# Patient Record
Sex: Female | Born: 1935 | Race: White | Hispanic: No | Smoking: Never smoker
Health system: Southern US, Community
[De-identification: ages and names within clinical notes are randomized; demographics above are authoritative.]

## PROBLEM LIST (undated history)

## (undated) DIAGNOSIS — F0390 Unspecified dementia without behavioral disturbance: Secondary | ICD-10-CM

## (undated) DIAGNOSIS — I10 Essential (primary) hypertension: Secondary | ICD-10-CM

## (undated) DIAGNOSIS — F329 Major depressive disorder, single episode, unspecified: Secondary | ICD-10-CM

## (undated) DIAGNOSIS — F32A Depression, unspecified: Secondary | ICD-10-CM

## (undated) DIAGNOSIS — H409 Unspecified glaucoma: Secondary | ICD-10-CM

## (undated) DIAGNOSIS — E7800 Pure hypercholesterolemia, unspecified: Secondary | ICD-10-CM

## (undated) HISTORY — PX: ANKLE FRACTURE SURGERY: SHX122

---

## 1999-03-28 ENCOUNTER — Other Ambulatory Visit: Admission: RE | Admit: 1999-03-28 | Discharge: 1999-03-28 | Payer: Self-pay | Admitting: Gynecology

## 2000-04-16 ENCOUNTER — Encounter: Admission: RE | Admit: 2000-04-16 | Discharge: 2000-04-16 | Payer: Self-pay | Admitting: Gynecology

## 2000-04-16 ENCOUNTER — Encounter: Payer: Self-pay | Admitting: Gynecology

## 2000-06-02 ENCOUNTER — Other Ambulatory Visit: Admission: RE | Admit: 2000-06-02 | Discharge: 2000-06-02 | Payer: Self-pay | Admitting: Gynecology

## 2001-05-11 ENCOUNTER — Encounter: Admission: RE | Admit: 2001-05-11 | Discharge: 2001-05-11 | Payer: Self-pay | Admitting: Gynecology

## 2001-05-11 ENCOUNTER — Encounter: Payer: Self-pay | Admitting: Gynecology

## 2001-06-23 ENCOUNTER — Other Ambulatory Visit: Admission: RE | Admit: 2001-06-23 | Discharge: 2001-06-23 | Payer: Self-pay | Admitting: Gynecology

## 2002-06-02 ENCOUNTER — Encounter: Payer: Self-pay | Admitting: Gynecology

## 2002-06-02 ENCOUNTER — Encounter: Admission: RE | Admit: 2002-06-02 | Discharge: 2002-06-02 | Payer: Self-pay | Admitting: Gynecology

## 2002-06-27 ENCOUNTER — Other Ambulatory Visit: Admission: RE | Admit: 2002-06-27 | Discharge: 2002-06-27 | Payer: Self-pay | Admitting: Gynecology

## 2003-06-30 ENCOUNTER — Encounter: Admission: RE | Admit: 2003-06-30 | Discharge: 2003-06-30 | Payer: Self-pay | Admitting: Orthopedic Surgery

## 2003-07-24 ENCOUNTER — Encounter: Admission: RE | Admit: 2003-07-24 | Discharge: 2003-07-24 | Payer: Self-pay | Admitting: Gynecology

## 2003-08-06 ENCOUNTER — Other Ambulatory Visit: Admission: RE | Admit: 2003-08-06 | Discharge: 2003-08-06 | Payer: Self-pay | Admitting: Gynecology

## 2004-03-22 ENCOUNTER — Inpatient Hospital Stay (HOSPITAL_COMMUNITY): Admission: RE | Admit: 2004-03-22 | Discharge: 2004-03-23 | Payer: Self-pay | Admitting: Orthopedic Surgery

## 2004-05-01 ENCOUNTER — Ambulatory Visit (HOSPITAL_COMMUNITY): Admission: RE | Admit: 2004-05-01 | Discharge: 2004-05-01 | Payer: Self-pay | Admitting: Internal Medicine

## 2004-09-17 ENCOUNTER — Encounter: Admission: RE | Admit: 2004-09-17 | Discharge: 2004-09-17 | Payer: Self-pay | Admitting: Gynecology

## 2005-09-18 ENCOUNTER — Ambulatory Visit: Payer: Self-pay | Admitting: Internal Medicine

## 2005-09-30 ENCOUNTER — Ambulatory Visit: Payer: Self-pay | Admitting: Internal Medicine

## 2005-09-30 ENCOUNTER — Encounter (INDEPENDENT_AMBULATORY_CARE_PROVIDER_SITE_OTHER): Payer: Self-pay | Admitting: *Deleted

## 2005-10-22 ENCOUNTER — Encounter: Admission: RE | Admit: 2005-10-22 | Discharge: 2005-10-22 | Payer: Self-pay | Admitting: Gynecology

## 2005-11-03 ENCOUNTER — Other Ambulatory Visit: Admission: RE | Admit: 2005-11-03 | Discharge: 2005-11-03 | Payer: Self-pay | Admitting: Gynecology

## 2006-12-17 ENCOUNTER — Encounter: Admission: RE | Admit: 2006-12-17 | Discharge: 2006-12-17 | Payer: Self-pay | Admitting: Gynecology

## 2008-02-07 ENCOUNTER — Encounter: Admission: RE | Admit: 2008-02-07 | Discharge: 2008-02-07 | Payer: Self-pay | Admitting: Gynecology

## 2009-02-27 ENCOUNTER — Encounter: Admission: RE | Admit: 2009-02-27 | Discharge: 2009-02-27 | Payer: Self-pay | Admitting: Gynecology

## 2009-12-23 ENCOUNTER — Emergency Department (HOSPITAL_COMMUNITY)
Admission: EM | Admit: 2009-12-23 | Discharge: 2009-12-23 | Payer: Self-pay | Source: Home / Self Care | Admitting: Emergency Medicine

## 2010-05-09 ENCOUNTER — Encounter: Admission: RE | Admit: 2010-05-09 | Discharge: 2010-05-09 | Payer: Self-pay | Admitting: Gynecology

## 2010-10-30 ENCOUNTER — Encounter: Payer: Self-pay | Admitting: Internal Medicine

## 2010-11-28 NOTE — Op Note (Signed)
NAME:  Mary Keller, Mary Keller                       ACCOUNT NO.:  [id number]   MEDICAL RECORD NO.:  [id number]                   PATIENT TYPE:  AMB   LOCATION:  DAY                                  FACILITY:  [facility]   PHYSICIAN:  Ronald A. Gioffre, M.D.             DATE OF BIRTH:  [birth year]   DATE OF PROCEDURE:  DATE OF DISCHARGE:  03/21/2004                                 OPERATIVE REPORT   PREOPERATIVE DIAGNOSES:  1.  Large extruded herniated disk at L4-5 on the right with a preop foot      drop on the right.  2.  Lateral recess stenosis severe at L4-5 on the right.   POSTOPERATIVE DIAGNOSES:  1.  Large extruded herniated disk at L4-5 on the right with a preop foot      drop on the right.  2.  Lateral recess stenosis severe at L4-5 on the right.   OPERATION:  1.  Hemilaminectomy and microdiskectomy at L4-5 on the right with a wide      foraminotomy because of the extruded fragment that was extruded      distalward.  2.  Decompression of the lateral recess for lateral recess stenosis.   SURGEON:  Georges Lynch. Darrelyn Hillock, M.D.   ASSISTANT:  Marlowe Kays, M.D.   DESCRIPTION OF PROCEDURE:  Under general anesthesia, the patient on a spinal  frame, she first had 1 g of IV Ancef.  At this time, two needles were placed  in the back for localization purposes, x-rays were taken.  Once we  identified the L4-5 interspace, we carried out the incision down a little  more distal and went down to the space and another x-ray was taken.  Several  x-rays were taken just to verify the exact final position.  Once the L4-5  space was identified, I then utilized the bur to bur down a portion of the  lamina at the L4-5 level and I then did a hemilaminectomy in the usual  fashion.  Her ligamentum flavum was severely thickened and compressing down  into the dura with the lateral recess stenosis so we went out at first  because of the large extruded disk and the stenosis, I did a nice  foraminotomy to  first identify the L5 root distally and we worked our way up  proximally and laterally. Once we cleaned out the lateral recess, we  cauterized lateral recess veins, the nerve root was under extreme amount of  tension, we gently retracted it and went down into the space to decompress  the space first and then went subligamentous with the nerve hook as well as  the Epstein curettes and removed multiple fragments of disk material.  We  also went down into the disk space and did a microdiskectomy as I mentioned  by use of the microscope. We went proximal, medial and distally. We went  down into the foramen, made sure there were no  other loose pieces of disk,  there was not. The nerve root now was free and it was easily movable as well  as the dura.  We made multiple passes up under the subligamentous region  which was just up under the nerve root to make sure there were no other  loose pieces of fragment.  We did a nice decompression of the lateral recess  as well and we made sure that the area proximal was clear as well with the  root above. We thoroughly irrigated out the area, loosely applied some  thrombin soaked Gelfoam and the wound was closed in layers in the usual  fashion with the deep distal portion of the wound  left open for drainage purposes. We left a small area open and the remaining  part of the wound was closed as I mentioned in the usual fashion. The skin  was closed with metal staples. She had 30 mg of IV Toradol at the end of the  procedure. She had 1 g of IV Ancef in surgery.                                               Ronald A. Darrelyn Hillock, M.D.    RAG/MEDQ  D:  03/21/2004  T:  03/22/2004  Job:  [id number]

## 2010-11-28 NOTE — Discharge Summary (Signed)
NAME:  Mary Keller, Mary Keller NO.:  [id number]   MEDICAL RECORD NO.:  [id number]          PATIENT TYPE:  INP   LOCATION:  0454                         FACILITY:  [facility]   PHYSICIAN:  Georges Lynch. Gioffre, M.D.DATE OF BIRTH:  May 24, 1934   DATE OF ADMISSION:  03/21/2004  DATE OF DISCHARGE:  03/23/2004                                 DISCHARGE SUMMARY   ADMISSION DIAGNOSES:  1.  Herniated disk L4-5.  2.  Spinal stenosis L4-5.   DISCHARGE DIAGNOSES:  1.  Status post hemilaminectomy and microdiskectomy L4-5 on the right.  2.  Foraminotomy and decompression for a lateral recess stenosis.   PROCEDURES:  The patient was taken to the operating room on March 21, 2004 to undergo microdiskectomy and hemilaminectomy of L4-5 on the right.   SURGEON:  Ranee Gosselin, M.D.   ASSISTANT:  Marlowe Kays, M.D.   ANESTHESIA:  The surgery was performed under general anesthesia.   BRIEF HISTORY:  This patient is a 77 year old female who has had back pain  radiating into her right lower extremity since August.  The symptoms are  worsening over the past few weeks.  The symptoms tend to be worse with  sitting, standing, and walking.  Pain medication reduced her pain somewhat.  She describes a burning sensation into her right buttock radiating down her  right posterior calf.  The patient was evaluated in the office by Dr.  Darrelyn Hillock.  She was found to have a ruptured disk at L4-5 on her MRI and was  admitted to the hospital for surgery.   LABORATORY DATA:  Admission hemoglobin 14.8, hematocrit 43.4.  Admission  chemistries sodium 139, potassium 3.9, chloride 104, CO2 of 25, glucose 102,  BUN 12, creatinine 0.9, calcium 9.9.  Admission EKG showed a nonspecific T  wave abnormality at a rate of 87.  Admission chest x-ray no acute  cardiopulmonary process.   HOSPITAL COURSE:  The patient was admitted to [facility] and taken  to the operating room.  She underwent the above stated  procedure without  complications.  She was allowed to return to the recovery room and then to  the orthopedic floor to continue her postoperative care.  The patient was  placed on PCA analgesia for pain control.  The pain was well controlled and  she was weaned over to oral analgesics and her PCA was discontinued on  postoperative day #1.  PT was consulted for gait training.  She was able to  ambulate with the aide of a walker.  The patient progressed well.  Her pain  was well controlled.  She was discharged home on postoperative day #2.   DISPOSITION:  Discharge date March 23, 2004.   DISCHARGE MEDICATIONS:  1.  Percocet 10/650 one-two every six hours as needed for pain.  2.  Robaxin 500 mg 1 every 6 hours as needed for muscle spasm.   DIET:  As tolerated.   ACTIVITY:  Walking.  No lifting or bending at the waist.   WOUND CARE:  Daily dressing changes.   FOLLOW UP:  The patient is scheduled to follow  up with Dr. Darrelyn Hillock two weeks  from the date of surgery.  She will call the office to schedule the  appointment.   CONDITION ON DISCHARGE:  Stable.      LKP/MEDQ  D:  04/23/2004  T:  04/23/2004  Job:  [id number]

## 2011-06-12 ENCOUNTER — Other Ambulatory Visit: Payer: Self-pay | Admitting: Gynecology

## 2011-06-12 DIAGNOSIS — Z1231 Encounter for screening mammogram for malignant neoplasm of breast: Secondary | ICD-10-CM

## 2011-06-17 ENCOUNTER — Ambulatory Visit
Admission: RE | Admit: 2011-06-17 | Discharge: 2011-06-17 | Disposition: A | Discharge status: Home / Self Care | Payer: Medicare Other | Source: Ambulatory Visit | Attending: Gynecology | Admitting: Gynecology

## 2011-06-17 DIAGNOSIS — Z1231 Encounter for screening mammogram for malignant neoplasm of breast: Secondary | ICD-10-CM

## 2011-06-23 ENCOUNTER — Other Ambulatory Visit: Payer: Self-pay | Admitting: Gynecology

## 2011-08-11 DIAGNOSIS — I10 Essential (primary) hypertension: Secondary | ICD-10-CM | POA: Diagnosis not present

## 2011-08-11 DIAGNOSIS — E119 Type 2 diabetes mellitus without complications: Secondary | ICD-10-CM | POA: Diagnosis not present

## 2011-08-11 DIAGNOSIS — H4011X0 Primary open-angle glaucoma, stage unspecified: Secondary | ICD-10-CM | POA: Diagnosis not present

## 2011-08-11 DIAGNOSIS — R82998 Other abnormal findings in urine: Secondary | ICD-10-CM | POA: Diagnosis not present

## 2011-08-11 DIAGNOSIS — E785 Hyperlipidemia, unspecified: Secondary | ICD-10-CM | POA: Diagnosis not present

## 2011-08-18 DIAGNOSIS — Z0000 Encounter for general adult medical examination without abnormal findings: Secondary | ICD-10-CM | POA: Diagnosis not present

## 2011-08-18 DIAGNOSIS — I10 Essential (primary) hypertension: Secondary | ICD-10-CM | POA: Diagnosis not present

## 2011-08-18 DIAGNOSIS — E785 Hyperlipidemia, unspecified: Secondary | ICD-10-CM | POA: Diagnosis not present

## 2011-08-18 DIAGNOSIS — F341 Dysthymic disorder: Secondary | ICD-10-CM | POA: Diagnosis not present

## 2011-08-24 DIAGNOSIS — Z1212 Encounter for screening for malignant neoplasm of rectum: Secondary | ICD-10-CM | POA: Diagnosis not present

## 2011-09-14 DIAGNOSIS — H4011X0 Primary open-angle glaucoma, stage unspecified: Secondary | ICD-10-CM | POA: Diagnosis not present

## 2011-11-03 DIAGNOSIS — H4310 Vitreous hemorrhage, unspecified eye: Secondary | ICD-10-CM | POA: Diagnosis not present

## 2011-11-03 DIAGNOSIS — H348392 Tributary (branch) retinal vein occlusion, unspecified eye, stable: Secondary | ICD-10-CM | POA: Diagnosis not present

## 2011-11-03 DIAGNOSIS — H409 Unspecified glaucoma: Secondary | ICD-10-CM | POA: Diagnosis not present

## 2011-11-03 DIAGNOSIS — H4011X0 Primary open-angle glaucoma, stage unspecified: Secondary | ICD-10-CM | POA: Diagnosis not present

## 2011-12-01 DIAGNOSIS — I10 Essential (primary) hypertension: Secondary | ICD-10-CM | POA: Diagnosis not present

## 2011-12-01 DIAGNOSIS — IMO0002 Reserved for concepts with insufficient information to code with codable children: Secondary | ICD-10-CM | POA: Diagnosis not present

## 2011-12-18 DIAGNOSIS — J029 Acute pharyngitis, unspecified: Secondary | ICD-10-CM | POA: Diagnosis not present

## 2011-12-18 DIAGNOSIS — I10 Essential (primary) hypertension: Secondary | ICD-10-CM | POA: Diagnosis not present

## 2011-12-18 DIAGNOSIS — M279 Disease of jaws, unspecified: Secondary | ICD-10-CM | POA: Diagnosis not present

## 2012-01-18 DIAGNOSIS — H4011X0 Primary open-angle glaucoma, stage unspecified: Secondary | ICD-10-CM | POA: Diagnosis not present

## 2012-01-18 DIAGNOSIS — H409 Unspecified glaucoma: Secondary | ICD-10-CM | POA: Diagnosis not present

## 2012-03-03 DIAGNOSIS — I10 Essential (primary) hypertension: Secondary | ICD-10-CM | POA: Diagnosis not present

## 2012-03-03 DIAGNOSIS — E669 Obesity, unspecified: Secondary | ICD-10-CM | POA: Diagnosis not present

## 2012-03-03 DIAGNOSIS — E119 Type 2 diabetes mellitus without complications: Secondary | ICD-10-CM | POA: Diagnosis not present

## 2012-03-03 DIAGNOSIS — E785 Hyperlipidemia, unspecified: Secondary | ICD-10-CM | POA: Diagnosis not present

## 2012-04-05 DIAGNOSIS — Z23 Encounter for immunization: Secondary | ICD-10-CM | POA: Diagnosis not present

## 2012-04-07 ENCOUNTER — Encounter: Payer: Self-pay | Admitting: Internal Medicine

## 2012-06-06 DIAGNOSIS — H4011X0 Primary open-angle glaucoma, stage unspecified: Secondary | ICD-10-CM | POA: Diagnosis not present

## 2012-06-06 DIAGNOSIS — H348392 Tributary (branch) retinal vein occlusion, unspecified eye, stable: Secondary | ICD-10-CM | POA: Diagnosis not present

## 2012-06-22 DIAGNOSIS — E785 Hyperlipidemia, unspecified: Secondary | ICD-10-CM | POA: Diagnosis not present

## 2012-06-22 DIAGNOSIS — I10 Essential (primary) hypertension: Secondary | ICD-10-CM | POA: Diagnosis not present

## 2012-06-22 DIAGNOSIS — E669 Obesity, unspecified: Secondary | ICD-10-CM | POA: Diagnosis not present

## 2012-06-22 DIAGNOSIS — F341 Dysthymic disorder: Secondary | ICD-10-CM | POA: Diagnosis not present

## 2012-07-14 DIAGNOSIS — H4011X0 Primary open-angle glaucoma, stage unspecified: Secondary | ICD-10-CM | POA: Diagnosis not present

## 2012-07-18 DIAGNOSIS — H4011X0 Primary open-angle glaucoma, stage unspecified: Secondary | ICD-10-CM | POA: Diagnosis not present

## 2012-07-26 ENCOUNTER — Other Ambulatory Visit: Payer: Self-pay | Admitting: Gynecology

## 2012-07-26 DIAGNOSIS — Z1231 Encounter for screening mammogram for malignant neoplasm of breast: Secondary | ICD-10-CM

## 2012-08-18 ENCOUNTER — Ambulatory Visit
Admission: RE | Admit: 2012-08-18 | Discharge: 2012-08-18 | Disposition: A | Discharge status: Home / Self Care | Payer: Medicare Other | Source: Ambulatory Visit | Attending: Gynecology | Admitting: Gynecology

## 2012-08-18 DIAGNOSIS — Z1231 Encounter for screening mammogram for malignant neoplasm of breast: Secondary | ICD-10-CM | POA: Diagnosis not present

## 2012-08-31 ENCOUNTER — Other Ambulatory Visit: Payer: Self-pay | Admitting: Gynecology

## 2012-09-01 DIAGNOSIS — E119 Type 2 diabetes mellitus without complications: Secondary | ICD-10-CM | POA: Diagnosis not present

## 2012-09-05 DIAGNOSIS — H4011X0 Primary open-angle glaucoma, stage unspecified: Secondary | ICD-10-CM | POA: Diagnosis not present

## 2012-09-08 DIAGNOSIS — Z0000 Encounter for general adult medical examination without abnormal findings: Secondary | ICD-10-CM | POA: Diagnosis not present

## 2012-09-09 ENCOUNTER — Ambulatory Visit
Admission: RE | Admit: 2012-09-09 | Discharge: 2012-09-09 | Disposition: A | Discharge status: Home / Self Care | Payer: Medicare Other | Source: Ambulatory Visit | Attending: Gynecology | Admitting: Gynecology

## 2012-09-09 DIAGNOSIS — M949 Disorder of cartilage, unspecified: Secondary | ICD-10-CM | POA: Diagnosis not present

## 2013-02-09 DIAGNOSIS — H4011X0 Primary open-angle glaucoma, stage unspecified: Secondary | ICD-10-CM | POA: Diagnosis not present

## 2013-02-13 DIAGNOSIS — H348392 Tributary (branch) retinal vein occlusion, unspecified eye, stable: Secondary | ICD-10-CM | POA: Diagnosis not present

## 2013-02-13 DIAGNOSIS — H4011X0 Primary open-angle glaucoma, stage unspecified: Secondary | ICD-10-CM | POA: Diagnosis not present

## 2013-03-08 DIAGNOSIS — M1990 Unspecified osteoarthritis, unspecified site: Secondary | ICD-10-CM | POA: Diagnosis not present

## 2013-03-08 DIAGNOSIS — Z6827 Body mass index (BMI) 27.0-27.9, adult: Secondary | ICD-10-CM | POA: Diagnosis not present

## 2013-03-08 DIAGNOSIS — Z23 Encounter for immunization: Secondary | ICD-10-CM | POA: Diagnosis not present

## 2013-03-08 DIAGNOSIS — E119 Type 2 diabetes mellitus without complications: Secondary | ICD-10-CM | POA: Diagnosis not present

## 2013-03-08 DIAGNOSIS — E785 Hyperlipidemia, unspecified: Secondary | ICD-10-CM | POA: Diagnosis not present

## 2013-03-08 DIAGNOSIS — I10 Essential (primary) hypertension: Secondary | ICD-10-CM | POA: Diagnosis not present

## 2013-03-08 DIAGNOSIS — Z1331 Encounter for screening for depression: Secondary | ICD-10-CM | POA: Diagnosis not present

## 2013-05-16 DIAGNOSIS — H4011X0 Primary open-angle glaucoma, stage unspecified: Secondary | ICD-10-CM | POA: Diagnosis not present

## 2013-05-16 DIAGNOSIS — Z961 Presence of intraocular lens: Secondary | ICD-10-CM | POA: Diagnosis not present

## 2013-05-16 DIAGNOSIS — H04129 Dry eye syndrome of unspecified lacrimal gland: Secondary | ICD-10-CM | POA: Diagnosis not present

## 2013-09-18 ENCOUNTER — Other Ambulatory Visit: Payer: Self-pay

## 2013-09-18 DIAGNOSIS — Z1231 Encounter for screening mammogram for malignant neoplasm of breast: Secondary | ICD-10-CM

## 2013-09-20 ENCOUNTER — Ambulatory Visit
Admission: RE | Admit: 2013-09-20 | Discharge: 2013-09-20 | Disposition: A | Discharge status: Home / Self Care | Payer: Medicare Other | Source: Ambulatory Visit

## 2013-09-20 DIAGNOSIS — Z0000 Encounter for general adult medical examination without abnormal findings: Secondary | ICD-10-CM | POA: Diagnosis not present

## 2013-09-20 DIAGNOSIS — Z1231 Encounter for screening mammogram for malignant neoplasm of breast: Secondary | ICD-10-CM | POA: Diagnosis not present

## 2013-09-20 DIAGNOSIS — Z780 Asymptomatic menopausal state: Secondary | ICD-10-CM | POA: Diagnosis not present

## 2013-09-20 DIAGNOSIS — Z01419 Encounter for gynecological examination (general) (routine) without abnormal findings: Secondary | ICD-10-CM | POA: Diagnosis not present

## 2013-09-22 DIAGNOSIS — E785 Hyperlipidemia, unspecified: Secondary | ICD-10-CM | POA: Diagnosis not present

## 2013-09-22 DIAGNOSIS — E119 Type 2 diabetes mellitus without complications: Secondary | ICD-10-CM | POA: Diagnosis not present

## 2013-09-22 DIAGNOSIS — I10 Essential (primary) hypertension: Secondary | ICD-10-CM | POA: Diagnosis not present

## 2013-09-22 DIAGNOSIS — R82998 Other abnormal findings in urine: Secondary | ICD-10-CM | POA: Diagnosis not present

## 2013-09-29 DIAGNOSIS — Z23 Encounter for immunization: Secondary | ICD-10-CM | POA: Diagnosis not present

## 2013-09-29 DIAGNOSIS — E785 Hyperlipidemia, unspecified: Secondary | ICD-10-CM | POA: Diagnosis not present

## 2013-09-29 DIAGNOSIS — Z6828 Body mass index (BMI) 28.0-28.9, adult: Secondary | ICD-10-CM | POA: Diagnosis not present

## 2013-09-29 DIAGNOSIS — M1990 Unspecified osteoarthritis, unspecified site: Secondary | ICD-10-CM | POA: Diagnosis not present

## 2013-09-29 DIAGNOSIS — E1169 Type 2 diabetes mellitus with other specified complication: Secondary | ICD-10-CM | POA: Diagnosis not present

## 2013-09-29 DIAGNOSIS — E669 Obesity, unspecified: Secondary | ICD-10-CM | POA: Diagnosis not present

## 2013-09-29 DIAGNOSIS — Z0000 Encounter for general adult medical examination without abnormal findings: Secondary | ICD-10-CM | POA: Diagnosis not present

## 2013-09-29 DIAGNOSIS — I10 Essential (primary) hypertension: Secondary | ICD-10-CM | POA: Diagnosis not present

## 2013-10-05 DIAGNOSIS — Z1212 Encounter for screening for malignant neoplasm of rectum: Secondary | ICD-10-CM | POA: Diagnosis not present

## 2013-10-30 DIAGNOSIS — H348392 Tributary (branch) retinal vein occlusion, unspecified eye, stable: Secondary | ICD-10-CM | POA: Diagnosis not present

## 2013-10-30 DIAGNOSIS — H409 Unspecified glaucoma: Secondary | ICD-10-CM | POA: Diagnosis not present

## 2013-10-30 DIAGNOSIS — H4011X0 Primary open-angle glaucoma, stage unspecified: Secondary | ICD-10-CM | POA: Diagnosis not present

## 2013-11-08 DIAGNOSIS — T8489XA Other specified complication of internal orthopedic prosthetic devices, implants and grafts, initial encounter: Secondary | ICD-10-CM | POA: Diagnosis not present

## 2013-11-08 DIAGNOSIS — M19079 Primary osteoarthritis, unspecified ankle and foot: Secondary | ICD-10-CM | POA: Diagnosis not present

## 2013-12-11 DIAGNOSIS — H409 Unspecified glaucoma: Secondary | ICD-10-CM | POA: Diagnosis not present

## 2013-12-11 DIAGNOSIS — H4011X0 Primary open-angle glaucoma, stage unspecified: Secondary | ICD-10-CM | POA: Diagnosis not present

## 2013-12-11 DIAGNOSIS — H348392 Tributary (branch) retinal vein occlusion, unspecified eye, stable: Secondary | ICD-10-CM | POA: Diagnosis not present

## 2014-01-08 DIAGNOSIS — H409 Unspecified glaucoma: Secondary | ICD-10-CM | POA: Diagnosis not present

## 2014-01-08 DIAGNOSIS — H4011X0 Primary open-angle glaucoma, stage unspecified: Secondary | ICD-10-CM | POA: Diagnosis not present

## 2014-02-14 DIAGNOSIS — H409 Unspecified glaucoma: Secondary | ICD-10-CM | POA: Diagnosis not present

## 2014-02-14 DIAGNOSIS — H4011X0 Primary open-angle glaucoma, stage unspecified: Secondary | ICD-10-CM | POA: Diagnosis not present

## 2014-04-04 DIAGNOSIS — Z23 Encounter for immunization: Secondary | ICD-10-CM | POA: Diagnosis not present

## 2014-04-04 DIAGNOSIS — E1169 Type 2 diabetes mellitus with other specified complication: Secondary | ICD-10-CM | POA: Diagnosis not present

## 2014-04-04 DIAGNOSIS — Z780 Asymptomatic menopausal state: Secondary | ICD-10-CM | POA: Diagnosis not present

## 2014-04-04 DIAGNOSIS — E785 Hyperlipidemia, unspecified: Secondary | ICD-10-CM | POA: Diagnosis not present

## 2014-04-04 DIAGNOSIS — M1990 Unspecified osteoarthritis, unspecified site: Secondary | ICD-10-CM | POA: Diagnosis not present

## 2014-04-04 DIAGNOSIS — Z1331 Encounter for screening for depression: Secondary | ICD-10-CM | POA: Diagnosis not present

## 2014-04-04 DIAGNOSIS — I10 Essential (primary) hypertension: Secondary | ICD-10-CM | POA: Diagnosis not present

## 2014-04-04 DIAGNOSIS — Z6827 Body mass index (BMI) 27.0-27.9, adult: Secondary | ICD-10-CM | POA: Diagnosis not present

## 2014-04-04 DIAGNOSIS — M899 Disorder of bone, unspecified: Secondary | ICD-10-CM | POA: Diagnosis not present

## 2014-10-03 DIAGNOSIS — E119 Type 2 diabetes mellitus without complications: Secondary | ICD-10-CM | POA: Diagnosis not present

## 2014-10-03 DIAGNOSIS — E669 Obesity, unspecified: Secondary | ICD-10-CM | POA: Diagnosis not present

## 2014-10-03 DIAGNOSIS — F418 Other specified anxiety disorders: Secondary | ICD-10-CM | POA: Diagnosis not present

## 2014-10-03 DIAGNOSIS — E785 Hyperlipidemia, unspecified: Secondary | ICD-10-CM | POA: Diagnosis not present

## 2014-10-03 DIAGNOSIS — M1990 Unspecified osteoarthritis, unspecified site: Secondary | ICD-10-CM | POA: Diagnosis not present

## 2014-10-03 DIAGNOSIS — Z1389 Encounter for screening for other disorder: Secondary | ICD-10-CM | POA: Diagnosis not present

## 2014-10-03 DIAGNOSIS — Z6827 Body mass index (BMI) 27.0-27.9, adult: Secondary | ICD-10-CM | POA: Diagnosis not present

## 2014-10-03 DIAGNOSIS — I10 Essential (primary) hypertension: Secondary | ICD-10-CM | POA: Diagnosis not present

## 2014-10-18 DIAGNOSIS — Z780 Asymptomatic menopausal state: Secondary | ICD-10-CM | POA: Diagnosis not present

## 2014-10-18 DIAGNOSIS — Z01419 Encounter for gynecological examination (general) (routine) without abnormal findings: Secondary | ICD-10-CM | POA: Diagnosis not present

## 2014-11-28 DIAGNOSIS — L259 Unspecified contact dermatitis, unspecified cause: Secondary | ICD-10-CM | POA: Diagnosis not present

## 2014-11-28 DIAGNOSIS — Z6827 Body mass index (BMI) 27.0-27.9, adult: Secondary | ICD-10-CM | POA: Diagnosis not present

## 2014-12-20 DIAGNOSIS — Z961 Presence of intraocular lens: Secondary | ICD-10-CM | POA: Diagnosis not present

## 2014-12-20 DIAGNOSIS — H4011X1 Primary open-angle glaucoma, mild stage: Secondary | ICD-10-CM | POA: Diagnosis not present

## 2014-12-21 DIAGNOSIS — H4011X1 Primary open-angle glaucoma, mild stage: Secondary | ICD-10-CM | POA: Diagnosis not present

## 2014-12-21 DIAGNOSIS — Z961 Presence of intraocular lens: Secondary | ICD-10-CM | POA: Diagnosis not present

## 2014-12-21 DIAGNOSIS — H4011X2 Primary open-angle glaucoma, moderate stage: Secondary | ICD-10-CM | POA: Diagnosis not present

## 2015-01-02 DIAGNOSIS — H4011X1 Primary open-angle glaucoma, mild stage: Secondary | ICD-10-CM | POA: Diagnosis not present

## 2015-01-02 DIAGNOSIS — H4011X2 Primary open-angle glaucoma, moderate stage: Secondary | ICD-10-CM | POA: Diagnosis not present

## 2015-03-22 ENCOUNTER — Other Ambulatory Visit: Payer: Self-pay

## 2015-03-22 DIAGNOSIS — Z1231 Encounter for screening mammogram for malignant neoplasm of breast: Secondary | ICD-10-CM

## 2015-03-27 ENCOUNTER — Ambulatory Visit
Admission: RE | Admit: 2015-03-27 | Discharge: 2015-03-27 | Disposition: A | Discharge status: Home / Self Care | Payer: Medicare Other | Source: Ambulatory Visit

## 2015-03-27 DIAGNOSIS — Z1231 Encounter for screening mammogram for malignant neoplasm of breast: Secondary | ICD-10-CM | POA: Diagnosis not present

## 2015-04-01 DIAGNOSIS — E119 Type 2 diabetes mellitus without complications: Secondary | ICD-10-CM | POA: Diagnosis not present

## 2015-04-01 DIAGNOSIS — N390 Urinary tract infection, site not specified: Secondary | ICD-10-CM | POA: Diagnosis not present

## 2015-04-01 DIAGNOSIS — I10 Essential (primary) hypertension: Secondary | ICD-10-CM | POA: Diagnosis not present

## 2015-04-01 DIAGNOSIS — R8290 Unspecified abnormal findings in urine: Secondary | ICD-10-CM | POA: Diagnosis not present

## 2015-04-01 DIAGNOSIS — E785 Hyperlipidemia, unspecified: Secondary | ICD-10-CM | POA: Diagnosis not present

## 2015-04-05 DIAGNOSIS — E119 Type 2 diabetes mellitus without complications: Secondary | ICD-10-CM | POA: Diagnosis not present

## 2015-04-05 DIAGNOSIS — Z6827 Body mass index (BMI) 27.0-27.9, adult: Secondary | ICD-10-CM | POA: Diagnosis not present

## 2015-04-05 DIAGNOSIS — I10 Essential (primary) hypertension: Secondary | ICD-10-CM | POA: Diagnosis not present

## 2015-04-05 DIAGNOSIS — Z0000 Encounter for general adult medical examination without abnormal findings: Secondary | ICD-10-CM | POA: Diagnosis not present

## 2015-04-05 DIAGNOSIS — M1990 Unspecified osteoarthritis, unspecified site: Secondary | ICD-10-CM | POA: Diagnosis not present

## 2015-04-05 DIAGNOSIS — E785 Hyperlipidemia, unspecified: Secondary | ICD-10-CM | POA: Diagnosis not present

## 2015-04-05 DIAGNOSIS — Z1389 Encounter for screening for other disorder: Secondary | ICD-10-CM | POA: Diagnosis not present

## 2015-04-05 DIAGNOSIS — F418 Other specified anxiety disorders: Secondary | ICD-10-CM | POA: Diagnosis not present

## 2015-04-05 DIAGNOSIS — Z23 Encounter for immunization: Secondary | ICD-10-CM | POA: Diagnosis not present

## 2015-04-05 DIAGNOSIS — E669 Obesity, unspecified: Secondary | ICD-10-CM | POA: Diagnosis not present

## 2015-05-01 DIAGNOSIS — Z1212 Encounter for screening for malignant neoplasm of rectum: Secondary | ICD-10-CM | POA: Diagnosis not present

## 2015-06-26 DIAGNOSIS — Z961 Presence of intraocular lens: Secondary | ICD-10-CM | POA: Diagnosis not present

## 2015-06-26 DIAGNOSIS — H1859 Other hereditary corneal dystrophies: Secondary | ICD-10-CM | POA: Diagnosis not present

## 2015-06-26 DIAGNOSIS — H401121 Primary open-angle glaucoma, left eye, mild stage: Secondary | ICD-10-CM | POA: Diagnosis not present

## 2015-06-26 DIAGNOSIS — H401112 Primary open-angle glaucoma, right eye, moderate stage: Secondary | ICD-10-CM | POA: Diagnosis not present

## 2015-09-25 DIAGNOSIS — H401131 Primary open-angle glaucoma, bilateral, mild stage: Secondary | ICD-10-CM | POA: Diagnosis not present

## 2015-09-25 DIAGNOSIS — H16143 Punctate keratitis, bilateral: Secondary | ICD-10-CM | POA: Diagnosis not present

## 2015-09-25 DIAGNOSIS — Z961 Presence of intraocular lens: Secondary | ICD-10-CM | POA: Diagnosis not present

## 2015-10-17 DIAGNOSIS — I10 Essential (primary) hypertension: Secondary | ICD-10-CM | POA: Diagnosis not present

## 2015-10-17 DIAGNOSIS — F418 Other specified anxiety disorders: Secondary | ICD-10-CM | POA: Diagnosis not present

## 2015-10-17 DIAGNOSIS — E119 Type 2 diabetes mellitus without complications: Secondary | ICD-10-CM | POA: Diagnosis not present

## 2015-10-17 DIAGNOSIS — Z1389 Encounter for screening for other disorder: Secondary | ICD-10-CM | POA: Diagnosis not present

## 2015-10-17 DIAGNOSIS — M1990 Unspecified osteoarthritis, unspecified site: Secondary | ICD-10-CM | POA: Diagnosis not present

## 2015-10-17 DIAGNOSIS — Z6826 Body mass index (BMI) 26.0-26.9, adult: Secondary | ICD-10-CM | POA: Diagnosis not present

## 2015-10-17 DIAGNOSIS — E669 Obesity, unspecified: Secondary | ICD-10-CM | POA: Diagnosis not present

## 2015-10-17 DIAGNOSIS — E784 Other hyperlipidemia: Secondary | ICD-10-CM | POA: Diagnosis not present

## 2015-10-18 DIAGNOSIS — H401131 Primary open-angle glaucoma, bilateral, mild stage: Secondary | ICD-10-CM | POA: Diagnosis not present

## 2015-10-28 DIAGNOSIS — H1859 Other hereditary corneal dystrophies: Secondary | ICD-10-CM | POA: Diagnosis not present

## 2015-10-28 DIAGNOSIS — H401131 Primary open-angle glaucoma, bilateral, mild stage: Secondary | ICD-10-CM | POA: Diagnosis not present

## 2015-10-28 DIAGNOSIS — H18529 Epithelial (juvenile) corneal dystrophy, unspecified eye: Secondary | ICD-10-CM | POA: Insufficient documentation

## 2015-12-02 DIAGNOSIS — H401131 Primary open-angle glaucoma, bilateral, mild stage: Secondary | ICD-10-CM | POA: Diagnosis not present

## 2016-01-17 ENCOUNTER — Other Ambulatory Visit: Payer: Self-pay

## 2016-01-17 DIAGNOSIS — Z6825 Body mass index (BMI) 25.0-25.9, adult: Secondary | ICD-10-CM | POA: Diagnosis not present

## 2016-01-17 DIAGNOSIS — N644 Mastodynia: Secondary | ICD-10-CM | POA: Diagnosis not present

## 2016-01-17 DIAGNOSIS — Z1231 Encounter for screening mammogram for malignant neoplasm of breast: Secondary | ICD-10-CM

## 2016-01-18 ENCOUNTER — Other Ambulatory Visit: Payer: Self-pay | Admitting: Registered Nurse

## 2016-01-18 DIAGNOSIS — N644 Mastodynia: Secondary | ICD-10-CM

## 2016-01-23 ENCOUNTER — Ambulatory Visit
Admission: RE | Admit: 2016-01-23 | Discharge: 2016-01-23 | Disposition: A | Discharge status: Home / Self Care | Payer: Medicare Other | Source: Ambulatory Visit | Attending: Registered Nurse | Admitting: Registered Nurse

## 2016-01-23 DIAGNOSIS — N644 Mastodynia: Secondary | ICD-10-CM

## 2016-03-04 DIAGNOSIS — H401131 Primary open-angle glaucoma, bilateral, mild stage: Secondary | ICD-10-CM | POA: Diagnosis not present

## 2016-03-04 DIAGNOSIS — H1859 Other hereditary corneal dystrophies: Secondary | ICD-10-CM | POA: Diagnosis not present

## 2016-04-17 ENCOUNTER — Other Ambulatory Visit: Payer: Self-pay | Admitting: Internal Medicine

## 2016-04-17 DIAGNOSIS — Z1231 Encounter for screening mammogram for malignant neoplasm of breast: Secondary | ICD-10-CM

## 2016-04-20 DIAGNOSIS — I10 Essential (primary) hypertension: Secondary | ICD-10-CM | POA: Diagnosis not present

## 2016-04-20 DIAGNOSIS — E119 Type 2 diabetes mellitus without complications: Secondary | ICD-10-CM | POA: Diagnosis not present

## 2016-04-20 DIAGNOSIS — N390 Urinary tract infection, site not specified: Secondary | ICD-10-CM | POA: Diagnosis not present

## 2016-04-20 DIAGNOSIS — E784 Other hyperlipidemia: Secondary | ICD-10-CM | POA: Diagnosis not present

## 2016-04-20 DIAGNOSIS — R8290 Unspecified abnormal findings in urine: Secondary | ICD-10-CM | POA: Diagnosis not present

## 2016-04-21 ENCOUNTER — Ambulatory Visit
Admission: RE | Admit: 2016-04-21 | Discharge: 2016-04-21 | Disposition: A | Discharge status: Home / Self Care | Payer: Medicare Other | Source: Ambulatory Visit | Attending: Internal Medicine | Admitting: Internal Medicine

## 2016-04-21 DIAGNOSIS — Z1231 Encounter for screening mammogram for malignant neoplasm of breast: Secondary | ICD-10-CM | POA: Diagnosis not present

## 2016-05-06 DIAGNOSIS — H04129 Dry eye syndrome of unspecified lacrimal gland: Secondary | ICD-10-CM | POA: Diagnosis not present

## 2016-05-06 DIAGNOSIS — E663 Overweight: Secondary | ICD-10-CM | POA: Diagnosis not present

## 2016-05-06 DIAGNOSIS — E119 Type 2 diabetes mellitus without complications: Secondary | ICD-10-CM | POA: Diagnosis not present

## 2016-05-06 DIAGNOSIS — Z23 Encounter for immunization: Secondary | ICD-10-CM | POA: Diagnosis not present

## 2016-05-06 DIAGNOSIS — E784 Other hyperlipidemia: Secondary | ICD-10-CM | POA: Diagnosis not present

## 2016-05-06 DIAGNOSIS — Z1389 Encounter for screening for other disorder: Secondary | ICD-10-CM | POA: Diagnosis not present

## 2016-05-06 DIAGNOSIS — F418 Other specified anxiety disorders: Secondary | ICD-10-CM | POA: Diagnosis not present

## 2016-05-06 DIAGNOSIS — M1990 Unspecified osteoarthritis, unspecified site: Secondary | ICD-10-CM | POA: Diagnosis not present

## 2016-05-06 DIAGNOSIS — N644 Mastodynia: Secondary | ICD-10-CM | POA: Diagnosis not present

## 2016-05-06 DIAGNOSIS — I10 Essential (primary) hypertension: Secondary | ICD-10-CM | POA: Diagnosis not present

## 2016-05-06 DIAGNOSIS — Z0000 Encounter for general adult medical examination without abnormal findings: Secondary | ICD-10-CM | POA: Diagnosis not present

## 2016-05-13 DIAGNOSIS — H401114 Primary open-angle glaucoma, right eye, indeterminate stage: Secondary | ICD-10-CM | POA: Diagnosis not present

## 2016-05-13 DIAGNOSIS — Z1212 Encounter for screening for malignant neoplasm of rectum: Secondary | ICD-10-CM | POA: Diagnosis not present

## 2016-05-13 DIAGNOSIS — H401121 Primary open-angle glaucoma, left eye, mild stage: Secondary | ICD-10-CM | POA: Diagnosis not present

## 2016-05-19 DIAGNOSIS — H401114 Primary open-angle glaucoma, right eye, indeterminate stage: Secondary | ICD-10-CM | POA: Diagnosis not present

## 2016-06-08 DIAGNOSIS — H1859 Other hereditary corneal dystrophies: Secondary | ICD-10-CM | POA: Diagnosis not present

## 2016-06-08 DIAGNOSIS — Z961 Presence of intraocular lens: Secondary | ICD-10-CM | POA: Diagnosis not present

## 2016-07-01 DIAGNOSIS — H401121 Primary open-angle glaucoma, left eye, mild stage: Secondary | ICD-10-CM | POA: Diagnosis not present

## 2016-07-01 DIAGNOSIS — H401112 Primary open-angle glaucoma, right eye, moderate stage: Secondary | ICD-10-CM | POA: Diagnosis not present

## 2016-07-03 DIAGNOSIS — H401190 Primary open-angle glaucoma, unspecified eye, stage unspecified: Secondary | ICD-10-CM | POA: Insufficient documentation

## 2016-08-10 DIAGNOSIS — H401121 Primary open-angle glaucoma, left eye, mild stage: Secondary | ICD-10-CM | POA: Diagnosis not present

## 2016-08-10 DIAGNOSIS — H401113 Primary open-angle glaucoma, right eye, severe stage: Secondary | ICD-10-CM | POA: Diagnosis not present

## 2016-08-12 DIAGNOSIS — H1859 Other hereditary corneal dystrophies: Secondary | ICD-10-CM | POA: Diagnosis not present

## 2016-08-12 DIAGNOSIS — Z961 Presence of intraocular lens: Secondary | ICD-10-CM | POA: Diagnosis not present

## 2018-09-13 ENCOUNTER — Ambulatory Visit (HOSPITAL_COMMUNITY)
Admission: EM | Admit: 2018-09-13 | Discharge: 2018-09-13 | Disposition: A | Discharge status: Home / Self Care | Payer: Medicare Other | Attending: Family Medicine | Admitting: Family Medicine

## 2018-09-13 ENCOUNTER — Encounter (HOSPITAL_COMMUNITY): Payer: Self-pay | Admitting: Emergency Medicine

## 2018-09-13 DIAGNOSIS — S0121XA Laceration without foreign body of nose, initial encounter: Secondary | ICD-10-CM

## 2018-09-13 DIAGNOSIS — W01198A Fall on same level from slipping, tripping and stumbling with subsequent striking against other object, initial encounter: Secondary | ICD-10-CM

## 2018-09-13 DIAGNOSIS — W108XXA Fall (on) (from) other stairs and steps, initial encounter: Secondary | ICD-10-CM

## 2018-09-13 DIAGNOSIS — S0181XA Laceration without foreign body of other part of head, initial encounter: Secondary | ICD-10-CM

## 2018-09-13 DIAGNOSIS — Z23 Encounter for immunization: Secondary | ICD-10-CM

## 2018-09-13 DIAGNOSIS — W19XXXA Unspecified fall, initial encounter: Secondary | ICD-10-CM

## 2018-09-13 HISTORY — DX: Major depressive disorder, single episode, unspecified: F32.9

## 2018-09-13 HISTORY — DX: Essential (primary) hypertension: I10

## 2018-09-13 HISTORY — DX: Unspecified dementia, unspecified severity, without behavioral disturbance, psychotic disturbance, mood disturbance, and anxiety: F03.90

## 2018-09-13 HISTORY — DX: Depression, unspecified: F32.A

## 2018-09-13 MED ORDER — TETANUS-DIPHTH-ACELL PERTUSSIS 5-2.5-18.5 LF-MCG/0.5 IM SUSP
0.5000 mL | Freq: Once | INTRAMUSCULAR | Status: AC
  Administered 2018-09-13: 0.5 mL via INTRAMUSCULAR

## 2018-09-13 MED ORDER — TETANUS-DIPHTH-ACELL PERTUSSIS 5-2.5-18.5 LF-MCG/0.5 IM SUSP
INTRAMUSCULAR | Status: AC
  Filled 2018-09-13: qty 0.5

## 2018-09-13 NOTE — ED Triage Notes (Signed)
Pt states she tripped down some stairs today and hit her nose on some bricks. Swelling to nose. No orbital or facial bone bone. Small lac noted to bridge of nose

## 2018-09-13 NOTE — Discharge Instructions (Addendum)
We placed 2 stiches in the nose Please follow up in 3 to 5 days for removal Do not place anything on the wound and keep clean and dry You can gently clean with mild soap and water in the shower.  Monitor for signs of infection and concussion symptoms Tetanus updated in the clinic.  Follow up as needed for continued or worsening symptoms

## 2018-09-13 NOTE — ED Provider Notes (Addendum)
[facility]    CSN: [id number] Arrival date & time: 09/13/18  1035     History   Chief Complaint Chief Complaint  Patient presents with  . Fall  . Facial Pain    HPI Mary Keller is a 85 y.o. female.   Patient is an 85 year old female with past medical history of dementia, depression, hypertension, glaucoma that presents today for fall.  She was walking up the stairs earlier today and tripped striking her nose on the brick stairs.  She suffered a small laceration to the bridge of her nose.  Bleeding is controlled.  She denies any loss of consciousness when she fell.  She denies any trouble breathing out of her nose.  No dizziness, headache, blurred vision.  Last tetanus was in 2012.  She is not currently on any blood thinners.   ROS per HPI      Past Medical History:  Diagnosis Date  . Dementia (HCC)   . Depression   . Hypertension     Patient Active Problem List   Diagnosis Date Noted  . Primary open-angle glaucoma 07/03/2016  . 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 Branch retinal vein occlusion 11/03/2011  . Vitreous hemorrhage (HCC) 11/03/2011    History reviewed. No pertinent surgical history.  OB History   No obstetric history on file.      Home Medications    Prior to Admission medications   Medication Sig Start Date End Date Taking? Authorizing Provider  atorvastatin (LIPITOR) 40 MG tablet Take 40 mg by mouth. 08/28/10  Yes [provider]  escitalopram (LEXAPRO) 20 MG tablet Take by mouth. 10/15/16  Yes [provider]  ibuprofen (ADVIL,MOTRIN) 200 MG tablet Take by mouth. 08/28/10  Yes [provider]  memantine (NAMENDA) 5 MG tablet Take 5 mg by mouth 2 (two) times daily.   Yes [provider]  ramipril (ALTACE) 5 MG capsule Take 5 mg by mouth once. 08/28/10  Yes [provider]    Family History Family History  Family history unknown: Yes    Social History Social History    Tobacco Use  . Smoking status: Never Smoker  Substance Use Topics  . Alcohol use: Not Currently    Frequency: Never  . Drug use: Not on file     Allergies   Penicillins and Sulfamethoxazole   Review of Systems Review of Systems  Musculoskeletal: Negative for gait problem.  Skin: Positive for wound.  Neurological: Negative for dizziness, tremors, seizures, syncope, facial asymmetry, speech difficulty, weakness, light-headedness, numbness and headaches.  Hematological: Does not bruise/bleed easily.  Psychiatric/Behavioral: Negative for agitation.     Physical Exam Triage Vital Signs ED Triage Vitals [09/13/18 1117]  Enc Vitals Group     BP (!) 161/86     Pulse Rate (!) 55     Resp 18     Temp 98.8 F (37.1 C)     Temp src      SpO2 100 %     Weight      Height      Head Circumference      Peak Flow      Pain Score      Pain Loc      Pain Edu?      Excl. in GC?    No data found.  Updated Vital Signs BP (!) 161/86   Pulse (!) 55   Temp 98.8 F (37.1 C)   Resp 18   SpO2 100%  Visual Acuity Right Eye Distance:   Left Eye Distance:   Bilateral Distance:    Right Eye Near:   Left Eye Near:    Bilateral Near:     Physical Exam Vitals signs and nursing note reviewed.  Constitutional:      General: She is not in acute distress. HENT:     Head: Normocephalic.      Nose:     Comments: Approximated 2 to 2-1/2 cm laceration to bridge of nose. Bleeding controlled No deformity to nasal bridge Mildly tender to palpation No bruising or battle sign     Mouth/Throat:     Pharynx: Oropharynx is clear.  Eyes:     Conjunctiva/sclera: Conjunctivae normal.  Neck:     Musculoskeletal: Normal range of motion.  Pulmonary:     Effort: Pulmonary effort is normal.  Musculoskeletal: Normal range of motion.  Skin:    General: Skin is warm and dry.  Neurological:     General: No focal deficit present.     Mental Status: She is alert. Mental status is at  baseline.  Psychiatric:        Mood and Affect: Mood normal.      UC Treatments / Results  Labs (all labs ordered are listed, but only abnormal results are displayed) Labs Reviewed - No data to display  EKG None  Radiology No results found.  Procedures Laceration Repair Date/Time: 09/13/2018 1:42 PM Performed by: Janace Aris, NP Authorized by: Eustace Moore, MD   Consent:    Consent obtained:  Verbal   Consent given by:  Patient   Alternatives discussed:  No treatment Anesthesia (see MAR for exact dosages):    Anesthesia method:  Local infiltration   Local anesthetic:  Lidocaine 2% w/o epi Laceration details:    Location:  Face   Face location:  Nose   Length (cm):  2 Repair type:    Repair type:  Simple Pre-procedure details:    Preparation:  Patient was prepped and draped in usual sterile fashion Exploration:    Hemostasis achieved with:  Direct pressure   Contaminated: no   Treatment:    Area cleansed with:  Betadine and saline   Amount of cleaning:  Standard   Irrigation solution:  Sterile saline   Visualized foreign bodies/material removed: no   Skin repair:    Repair method:  Sutures   Suture size:  5-0   Suture material:  Nylon   Suture technique:  Simple interrupted   Number of sutures:  2 Approximation:    Approximation:  Close Post-procedure details:    Dressing:  Open (no dressing)   Patient tolerance of procedure:  Tolerated well, no immediate complications   (including critical care time)  Medications Ordered in UC Medications  Tdap (BOOSTRIX) injection 0.5 mL (0.5 mLs Intramuscular Given 09/13/18 1236)    Initial Impression / Assessment and Plan / UC Course  I have reviewed the triage vital signs and the nursing notes.  Pertinent labs & imaging results that were available during my care of the patient were reviewed by me and considered in my medical decision making (see chart for details).     Patient is a 85 year old female  with history of dementia who presents today for fall and nose injury. She is neurologically at baseline. No concerning signs or symptoms VSS Nose does not appear to be broken. Small laceration to the bridge of the nose repaired here with 2 stiches.  Instructed to some back  for removal in 3-5 days.  Pt tolerated well.  Spoke with daughter about monitoring for signs of concussion which include dizziness, headache, changes in vision, nausea or vomiting. If she develops any of this she will need to go to the ER Daughter understanding Final Clinical Impressions(s) / UC Diagnoses   Final diagnoses:  Fall, initial encounter  Facial laceration, initial encounter     Discharge Instructions     We placed 2 stiches in the nose Please follow up in 3 to 5 days for removal Do not place anything on the wound and keep clean and dry You can gently clean with mild soap and water in the shower.  Monitor for signs of infection and concussion symptoms Tetanus updated in the clinic.  Follow up as needed for continued or worsening symptoms     ED Prescriptions    None     Controlled Substance Prescriptions [place] Controlled Substance Registry consulted? Not Applicable        Janace ArisBast, Kandance Yano A, NP 09/13/18 1345

## 2018-09-17 ENCOUNTER — Ambulatory Visit (HOSPITAL_COMMUNITY)
Admission: EM | Admit: 2018-09-17 | Discharge: 2018-09-17 | Disposition: A | Discharge status: Home / Self Care | Payer: Medicare Other

## 2018-09-17 ENCOUNTER — Encounter (HOSPITAL_COMMUNITY): Payer: Self-pay

## 2018-09-17 NOTE — ED Triage Notes (Signed)
Pt came in to have her suture removed from her nose that was done on 09/13/2018

## 2019-07-30 ENCOUNTER — Ambulatory Visit: Payer: Medicare Other | Attending: Internal Medicine

## 2019-07-30 DIAGNOSIS — Z23 Encounter for immunization: Secondary | ICD-10-CM

## 2019-07-30 NOTE — Progress Notes (Signed)
   Covid-19 Vaccination Clinic  Name:  SHEVELLE SMITHER    MRN: [id number] DOB: December 18, 1933  07/30/2019  Ms. Newbury was observed post Covid-19 immunization for 15 minutes without incidence. She was provided with Vaccine Information Sheet and instruction to access the V-Safe system.   Ms. Greenly was instructed to call 911 with any severe reactions post vaccine: Marland Kitchen Difficulty breathing  . Swelling of your face and throat  . A fast heartbeat  . A bad rash all over your body  . Dizziness and weakness

## 2019-08-17 ENCOUNTER — Ambulatory Visit: Payer: Medicare PPO | Attending: Internal Medicine

## 2019-08-17 DIAGNOSIS — Z23 Encounter for immunization: Secondary | ICD-10-CM | POA: Insufficient documentation

## 2019-08-17 NOTE — Progress Notes (Signed)
   Covid-19 Vaccination Clinic  Name:  Mary Keller    MRN: [id number] DOB: [birth year]  08/17/2019  Mary Keller was observed post Covid-19 immunization for 15 minutes without incidence. She was provided with Vaccine Information Sheet and instruction to access the V-Safe system.   Mary Keller was instructed to call 911 with any severe reactions post vaccine: Marland Kitchen Difficulty breathing  . Swelling of your face and throat  . A fast heartbeat  . A bad rash all over your body  . Dizziness and weakness    Immunizations Administered    Name Date Dose VIS Date Route   Pfizer COVID-19 Vaccine 08/17/2019  5:18 PM 0.3 mL 06/23/2019 Intramuscular   Manufacturer: [employer], [employer]   Lot: [id number]   NDC: [address]-5

## 2020-01-03 DIAGNOSIS — E7849 Other hyperlipidemia: Secondary | ICD-10-CM | POA: Diagnosis not present

## 2020-01-03 DIAGNOSIS — E1169 Type 2 diabetes mellitus with other specified complication: Secondary | ICD-10-CM | POA: Diagnosis not present

## 2020-01-03 DIAGNOSIS — Z0000 Encounter for general adult medical examination without abnormal findings: Secondary | ICD-10-CM | POA: Diagnosis not present

## 2020-01-10 DIAGNOSIS — I10 Essential (primary) hypertension: Secondary | ICD-10-CM | POA: Diagnosis not present

## 2020-01-10 DIAGNOSIS — M1990 Unspecified osteoarthritis, unspecified site: Secondary | ICD-10-CM | POA: Diagnosis not present

## 2020-01-10 DIAGNOSIS — E785 Hyperlipidemia, unspecified: Secondary | ICD-10-CM | POA: Diagnosis not present

## 2020-01-10 DIAGNOSIS — E1169 Type 2 diabetes mellitus with other specified complication: Secondary | ICD-10-CM | POA: Diagnosis not present

## 2020-01-10 DIAGNOSIS — G479 Sleep disorder, unspecified: Secondary | ICD-10-CM | POA: Diagnosis not present

## 2020-01-10 DIAGNOSIS — Z0000 Encounter for general adult medical examination without abnormal findings: Secondary | ICD-10-CM | POA: Diagnosis not present

## 2020-01-10 DIAGNOSIS — I77819 Aortic ectasia, unspecified site: Secondary | ICD-10-CM | POA: Diagnosis not present

## 2020-01-10 DIAGNOSIS — I493 Ventricular premature depolarization: Secondary | ICD-10-CM | POA: Diagnosis not present

## 2020-01-10 DIAGNOSIS — F418 Other specified anxiety disorders: Secondary | ICD-10-CM | POA: Diagnosis not present

## 2020-01-22 DIAGNOSIS — H401113 Primary open-angle glaucoma, right eye, severe stage: Secondary | ICD-10-CM | POA: Diagnosis not present

## 2020-01-22 DIAGNOSIS — H401121 Primary open-angle glaucoma, left eye, mild stage: Secondary | ICD-10-CM | POA: Diagnosis not present

## 2020-02-05 DIAGNOSIS — H5203 Hypermetropia, bilateral: Secondary | ICD-10-CM | POA: Diagnosis not present

## 2020-02-05 DIAGNOSIS — H52203 Unspecified astigmatism, bilateral: Secondary | ICD-10-CM | POA: Diagnosis not present

## 2020-02-05 DIAGNOSIS — H524 Presbyopia: Secondary | ICD-10-CM | POA: Diagnosis not present

## 2020-04-14 ENCOUNTER — Ambulatory Visit (HOSPITAL_COMMUNITY)
Admission: EM | Admit: 2020-04-14 | Discharge: 2020-04-14 | Disposition: A | Discharge status: Home / Self Care | Payer: Medicare PPO

## 2020-04-14 ENCOUNTER — Other Ambulatory Visit: Payer: Self-pay

## 2020-04-14 ENCOUNTER — Ambulatory Visit (INDEPENDENT_AMBULATORY_CARE_PROVIDER_SITE_OTHER): Payer: Medicare PPO

## 2020-04-14 ENCOUNTER — Encounter (HOSPITAL_COMMUNITY): Payer: Self-pay | Admitting: *Deleted

## 2020-04-14 DIAGNOSIS — M7918 Myalgia, other site: Secondary | ICD-10-CM | POA: Diagnosis not present

## 2020-04-14 DIAGNOSIS — S59902A Unspecified injury of left elbow, initial encounter: Secondary | ICD-10-CM

## 2020-04-14 DIAGNOSIS — M79605 Pain in left leg: Secondary | ICD-10-CM

## 2020-04-14 DIAGNOSIS — W19XXXA Unspecified fall, initial encounter: Secondary | ICD-10-CM

## 2020-04-14 DIAGNOSIS — M25522 Pain in left elbow: Secondary | ICD-10-CM

## 2020-04-14 DIAGNOSIS — Y92009 Unspecified place in unspecified non-institutional (private) residence as the place of occurrence of the external cause: Secondary | ICD-10-CM | POA: Diagnosis not present

## 2020-04-14 DIAGNOSIS — S0990XA Unspecified injury of head, initial encounter: Secondary | ICD-10-CM | POA: Diagnosis not present

## 2020-04-14 DIAGNOSIS — M7989 Other specified soft tissue disorders: Secondary | ICD-10-CM

## 2020-04-14 DIAGNOSIS — S80812A Abrasion, left lower leg, initial encounter: Secondary | ICD-10-CM

## 2020-04-14 DIAGNOSIS — S80212A Abrasion, left knee, initial encounter: Secondary | ICD-10-CM | POA: Diagnosis not present

## 2020-04-14 HISTORY — DX: Unspecified glaucoma: H40.9

## 2020-04-14 HISTORY — DX: Pure hypercholesterolemia, unspecified: E78.00

## 2020-04-14 NOTE — ED Triage Notes (Addendum)
Pt and family member state she fell down approx 2 steps this AM.  Via camera footage, family states it didn't appear pt had head injury.  Denies LOC. C/O multiple left elbow, bilat lower leg abrasions and areas of swelling.  Pt ambulatory.  C/O generalized discomfort "everywhere".  Denies n/v.  Daughter states behavior normal. Pt's right pupil < left pupil; daughter states pt does use Rx eye drops in right eye only.

## 2020-04-14 NOTE — ED Provider Notes (Signed)
[facility]   [id number] 04/14/20 Arrival Time: 1534  PJ:ASNKN PAIN  SUBJECTIVE: History from: patient and family. Mary Keller is a 86 y.o. female complains of left elbow, left knee, and left lower leg pain. Family reports that she fell down about 2 steps earlier today. Family reports that they could see this on camera footage, fall was unwitnessed in person. Denies LOC, headache, changes in vision, nausea, vomiting, difficulty concentrating. Has not attempted OTC treatment. Symptoms are made worse with activity. Family reports a fall in the past, but with no injuries. Denies fever, chills, effusion, weakness, numbness and tingling, saddle paresthesias, loss of bowel or bladder function.      ROS: As per HPI.  All other pertinent ROS negative.     Past Medical History:  Diagnosis Date  . Dementia (HCC)   . Depression   . Glaucoma   . Hypercholesteremia   . Hypertension    Past Surgical History:  Procedure Laterality Date  . ANKLE FRACTURE SURGERY     Allergies  Allergen Reactions  . Penicillins Other (See Comments)    ithcy hands, uncomfortable feeling ithcy hands, uncomfortable feeling   . Sulfamethoxazole Itching    08/28/2010  08/28/2010     No current facility-administered medications on file prior to encounter.   Current Outpatient Medications on File Prior to Encounter  Medication Sig Dispense Refill  . atorvastatin (LIPITOR) 40 MG tablet Take 40 mg by mouth.    . Brimonidine Tartrate-Timolol (COMBIGAN OP) Apply to eye. Both eyes    . brinzolamide (AZOPT) 1 % ophthalmic suspension 1 drop 3 (three) times daily. Right eye only    . escitalopram (LEXAPRO) 20 MG tablet Take by mouth.    . latanoprost (XALATAN) 0.005 % ophthalmic solution 1 drop at bedtime. Both eyes    . memantine (NAMENDA) 5 MG tablet Take 5 mg by mouth 2 (two) times daily.    . pilocarpine (PILOCAR) 4 % ophthalmic solution 1 drop 4 (four) times daily. Right eye only    . ramipril  (ALTACE) 5 MG capsule Take 5 mg by mouth once.    Marland Kitchen ibuprofen (ADVIL,MOTRIN) 200 MG tablet Take by mouth.     Social History   Socioeconomic History  . Marital status: Married    Spouse name: Not on file  . Number of children: Not on file  . Years of education: Not on file  . Highest education level: Not on file  Occupational History  . Not on file  Tobacco Use  . Smoking status: Never Smoker  Vaping Use  . Vaping Use: Never used  Substance and Sexual Activity  . Alcohol use: Not Currently  . Drug use: Never  . Sexual activity: Not on file  Other Topics Concern  . Not on file  Social History Narrative  . Not on file   Social Determinants of Health   Financial Resource Strain:   . Difficulty of Paying Living Expenses: Not on file  Food Insecurity:   . Worried About Programme researcher, broadcasting/film/video in the Last Year: Not on file  . Ran Out of Food in the Last Year: Not on file  Transportation Needs:   . Lack of Transportation (Medical): Not on file  . Lack of Transportation (Non-Medical): Not on file  Physical Activity:   . Days of Exercise per Week: Not on file  . Minutes of Exercise per Session: Not on file  Stress:   . Feeling of Stress : Not on file  Social Connections:   . Frequency of Communication with Friends and Family: Not on file  . Frequency of Social Gatherings with Friends and Family: Not on file  . Attends Religious Services: Not on file  . Active Member of Clubs or Organizations: Not on file  . Attends Banker Meetings: Not on file  . Marital Status: Not on file  Intimate Partner Violence:   . Fear of Current or Ex-Partner: Not on file  . Emotionally Abused: Not on file  . Physically Abused: Not on file  . Sexually Abused: Not on file   Family History  Family history unknown: Yes    OBJECTIVE:  Vitals:   04/14/20 1651  BP: (!) 104/56  Pulse: 61  Resp: 16  Temp: 98.3 F (36.8 C)  TempSrc: Oral  SpO2: 97%    General appearance: ALERT;  in no acute distress.  Head: NCAT Lungs: Normal respiratory effort CV: pulses 2+ bilaterally. Cap refill < 2 seconds Musculoskeletal:  Inspection: Skin warm, dry. Abrasion to L elbow, L knee, and shearing abrasion to L shin Palpation: L elbow tender to palpation ROM: FROM active and passive Skin: warm and dry Neurologic: Ambulates without difficulty; Sensation intact about the upper/ lower extremities Psychological: alert and cooperative; normal mood and affect  DIAGNOSTIC STUDIES:  DG Elbow Complete Left  Result Date: 04/14/2020 CLINICAL DATA:  Left elbow pain since an injury when this morning. The patient fell down stairs EXAM: LEFT ELBOW - COMPLETE 3+ VIEW COMPARISON:  None. FINDINGS: There is no evidence of fracture, dislocation, or joint effusion. There is no evidence of arthropathy or other focal bone abnormality. Mild soft tissue swelling is seen posterior to the olecranon. Subcutaneous calcifications in the posterior aspect of the proximal forearm are likely due to some remote injury. IMPRESSION: No acute bony or joint abnormality. Mild soft tissue swelling posterior to the olecranon may be due to bursitis. Electronically Signed   By: Drusilla Kanner M.D.   On: 04/14/2020 17:42   DG Tibia/Fibula Left  Result Date: 04/14/2020 CLINICAL DATA:  Pt and family member state she fell down approx 2 steps this AM. Via camera footage, family states it didn't appear pt had head injury. Denies LOC. C/O multiple left elbow, bilat lower leg abrasions and areas of swelling. EXAM: LEFT TIBIA AND FIBULA - 2 VIEW COMPARISON:  None. FINDINGS: There is no evidence of fracture or other focal bone lesions. Degenerative changes in the knee. Vascular calcifications in the regional soft tissues. IMPRESSION: Negative. Electronically Signed   By: Emmaline Kluver M.D.   On: 04/14/2020 17:37     ASSESSMENT & PLAN:  1. Fall in home, initial encounter   2. Left elbow pain   3. Left leg pain   4.  Musculoskeletal pain    Xray of L elbow and L tib fib negative today Wounds cleansed and dressed in office Continue conservative management of rest, ice, and gentle stretches Take ibuprofen as needed for pain relief (may cause abdominal discomfort, ulcers, and GI bleeds avoid taking with other NSAIDs) Follow up with PCP if symptoms persist Return or go to the ER if you have any new or worsening symptoms (fever, chills, chest pain, abdominal pain, changes in bowel or bladder habits, pain radiating into lower legs)   Reviewed expectations re: course of current medical issues. Questions answered. Outlined signs and symptoms indicating need for more acute intervention. Patient verbalized understanding. After Visit Summary given.       Moshe Cipro, NP  04/15/20 1234  

## 2020-04-14 NOTE — Discharge Instructions (Addendum)
Xrays today are negative for fractures or misalignments  If pain and swelling are persisting past the next 2 days, follow up with orthopedics as needed. You should be able to see some healing by then.  Use ice to the painful and swollen areas. May use ibuprofen or tylenol as needed for pain  Follow up with the ER for any mental status changes, nausea, vomiting, changes in vision, trouble breathing, trouble swallowing, loss of sensation, loss of consciousness, other concerning symptoms

## 2020-04-22 ENCOUNTER — Ambulatory Visit (HOSPITAL_COMMUNITY)
Admission: EM | Admit: 2020-04-22 | Discharge: 2020-04-22 | Disposition: A | Discharge status: Home / Self Care | Payer: Medicare PPO | Attending: Internal Medicine | Admitting: Internal Medicine

## 2020-04-22 ENCOUNTER — Other Ambulatory Visit: Payer: Self-pay

## 2020-04-22 ENCOUNTER — Encounter (HOSPITAL_COMMUNITY): Payer: Self-pay | Admitting: Emergency Medicine

## 2020-04-22 DIAGNOSIS — T7840XA Allergy, unspecified, initial encounter: Secondary | ICD-10-CM | POA: Diagnosis not present

## 2020-04-22 MED ORDER — FAMOTIDINE 20 MG PO TABS: 20.0000 mg | ORAL_TABLET | Freq: Two times a day (BID) | ORAL | 0 refills | Status: DC

## 2020-04-22 MED ORDER — PREDNISONE 10 MG PO TABS
10.0000 mg | ORAL_TABLET | Freq: Every day | ORAL | 0 refills | Status: AC
Start: 2020-04-22 — End: 2020-04-25

## 2020-04-22 MED ORDER — DIPHENHYDRAMINE HCL 25 MG PO TABS: 25.0000 mg | ORAL_TABLET | Freq: Four times a day (QID) | ORAL | 0 refills | Status: DC | PRN

## 2020-04-22 NOTE — Discharge Instructions (Addendum)
Please take medications as directed Return to urgent care if symptoms worsen.

## 2020-04-22 NOTE — ED Provider Notes (Signed)
[facility]    CSN: [id number] Arrival date & time: 04/22/20  0998      History   Chief Complaint Chief Complaint  Patient presents with  . Insect Bite    HPI Mary Keller is a 86 y.o. female with past medical history of hypertension and dementia who is here for multiple insect stings.  Patient was retrieving her mail this morning when she stuck a piece of plastic in between 2 logs in her yard which brought out a yellowjacket hive.  Patient received multiple stings along both arms, wrists, and forehead.  Patient has notable erythema and swelling with punctual injection sites.  No stingers arer reported to be still in her arm.  She denies breathing difficulty, chest pain.  History was elicited from caregiver arrived shortly after the event.  Patient was given Benadryl and Advil shortly after.  Reports no known allergies to yellow jackets.  HPI  Past Medical History:  Diagnosis Date  . Dementia (HCC)   . Depression   . Glaucoma   . Hypercholesteremia   . Hypertension     Patient Active Problem List   Diagnosis Date Noted  . Primary open-angle glaucoma 07/03/2016  . 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 Branch retinal vein occlusion 11/03/2011  . Vitreous hemorrhage (HCC) 11/03/2011    Past Surgical History:  Procedure Laterality Date  . ANKLE FRACTURE SURGERY      OB History   No obstetric history on file.      Home Medications    Prior to Admission medications   Medication Sig Start Date End Date Taking? Authorizing Provider  atorvastatin (LIPITOR) 40 MG tablet Take 40 mg by mouth. 08/28/10   [provider]  Brimonidine Tartrate-Timolol (COMBIGAN OP) Apply to eye. Both eyes    [provider]  brinzolamide (AZOPT) 1 % ophthalmic suspension 1 drop 3 (three) times daily. Right eye only    [provider]  escitalopram (LEXAPRO) 20 MG tablet Take by mouth. 10/15/16   [provider]  ibuprofen  (ADVIL,MOTRIN) 200 MG tablet Take by mouth. 08/28/10   [provider]  latanoprost (XALATAN) 0.005 % ophthalmic solution 1 drop at bedtime. Both eyes    [provider]  memantine (NAMENDA) 5 MG tablet Take 5 mg by mouth 2 (two) times daily.    [provider]  pilocarpine (PILOCAR) 4 % ophthalmic solution 1 drop 4 (four) times daily. Right eye only    [provider]  ramipril (ALTACE) 5 MG capsule Take 5 mg by mouth once. 08/28/10   [provider]    Family History Family History  Family history unknown: Yes    Social History Social History   Tobacco Use  . Smoking status: Never Smoker  Vaping Use  . Vaping Use: Never used  Substance Use Topics  . Alcohol use: Not Currently  . Drug use: Never     Allergies   Penicillins and Sulfamethoxazole   Review of Systems Review of Systems See HPI  Physical Exam Triage Vital Signs ED Triage Vitals [04/22/20 1202]  Enc Vitals Group     BP (!) 164/81     Pulse Rate (!) 55     Resp 16     Temp 97.6 F (36.4 C)     Temp Source Oral     SpO2 98 %     Weight      Height      Head Circumference  Peak Flow      Pain Score      Pain Loc      Pain Edu?      Excl. in GC?    No data found.  Updated Vital Signs BP (!) 164/81 (BP Location: Right Arm)   Pulse (!) 55   Temp 97.6 F (36.4 C) (Oral)   Resp 16   SpO2 98%   Visual Acuity Right Eye Distance:   Left Eye Distance:   Bilateral Distance:    Right Eye Near:   Left Eye Near:    Bilateral Near:     Physical Exam Vitals and nursing note reviewed.  Constitutional:      General: She is not in acute distress.    Appearance: Normal appearance. She is well-developed.  HENT:     Head: Normocephalic and atraumatic.  Eyes:     Conjunctiva/sclera: Conjunctivae normal.  Cardiovascular:     Rate and Rhythm: Normal rate and regular rhythm.     Heart sounds: No murmur heard.   Pulmonary:     Effort: Pulmonary effort  is normal. No respiratory distress.     Breath sounds: Normal breath sounds.  Abdominal:     Palpations: Abdomen is soft.     Tenderness: There is no abdominal tenderness.  Musculoskeletal:     Cervical back: Neck supple.  Skin:    General: Skin is warm and dry.     Capillary Refill: Capillary refill takes less than 2 seconds.     Findings: Erythema present.          Comments: Patient has multiple, small puncture marks along bilateral arms and wrist.  One on forehead.  Noticeable erythema and swelling surrounding sites.  Extremity perfusion intact.  Neurological:     Mental Status: She is alert.  Psychiatric:     Comments: Patient has a history of dementia      UC Treatments / Results  Labs (all labs ordered are listed, but only abnormal results are displayed) Labs Reviewed - No data to display  EKG   Radiology No results found.  Procedures Procedures (including critical care time)  Medications Ordered in UC Medications - No data to display  Initial Impression / Assessment and Plan / UC Course  I have reviewed the triage vital signs and the nursing notes.  Pertinent labs & imaging results that were available during my care of the patient were reviewed by me and considered in my medical decision making (see chart for details).      1.  Allergic reaction to yellowjacket sting: Continue Benadryl every 6 hours x 2 doses Famotidine 20 mg orally daily x2 days Prednisone 20 mg orally daily x3 days Return to urgent care if symptoms worsen. Final Clinical Impressions(s) / UC Diagnoses   Final diagnoses:  None   Discharge Instructions   None    ED Prescriptions    None     PDMP not reviewed this encounter.   Merrilee Jansky, MD 04/22/20 [phone]

## 2020-04-22 NOTE — ED Triage Notes (Signed)
Pt presents with multiple bee stings that occurred this AM. Denies SOB, swelling. Care giver states pt was given dose of Benadryl at 940, advil given at 1015.

## 2020-05-01 DIAGNOSIS — Z23 Encounter for immunization: Secondary | ICD-10-CM | POA: Diagnosis not present

## 2020-05-13 DIAGNOSIS — E1169 Type 2 diabetes mellitus with other specified complication: Secondary | ICD-10-CM | POA: Diagnosis not present

## 2020-05-13 DIAGNOSIS — Z9181 History of falling: Secondary | ICD-10-CM | POA: Diagnosis not present

## 2020-05-13 DIAGNOSIS — I10 Essential (primary) hypertension: Secondary | ICD-10-CM | POA: Diagnosis not present

## 2020-05-13 DIAGNOSIS — R413 Other amnesia: Secondary | ICD-10-CM | POA: Diagnosis not present

## 2020-05-27 DIAGNOSIS — H401113 Primary open-angle glaucoma, right eye, severe stage: Secondary | ICD-10-CM | POA: Diagnosis not present

## 2020-05-27 DIAGNOSIS — H401121 Primary open-angle glaucoma, left eye, mild stage: Secondary | ICD-10-CM | POA: Diagnosis not present

## 2020-08-08 DIAGNOSIS — M1990 Unspecified osteoarthritis, unspecified site: Secondary | ICD-10-CM | POA: Diagnosis not present

## 2020-08-08 DIAGNOSIS — E1169 Type 2 diabetes mellitus with other specified complication: Secondary | ICD-10-CM | POA: Diagnosis not present

## 2020-08-08 DIAGNOSIS — I77819 Aortic ectasia, unspecified site: Secondary | ICD-10-CM | POA: Diagnosis not present

## 2020-08-08 DIAGNOSIS — I493 Ventricular premature depolarization: Secondary | ICD-10-CM | POA: Diagnosis not present

## 2020-08-08 DIAGNOSIS — E785 Hyperlipidemia, unspecified: Secondary | ICD-10-CM | POA: Diagnosis not present

## 2020-08-08 DIAGNOSIS — I10 Essential (primary) hypertension: Secondary | ICD-10-CM | POA: Diagnosis not present

## 2020-08-08 DIAGNOSIS — E669 Obesity, unspecified: Secondary | ICD-10-CM | POA: Diagnosis not present

## 2020-08-08 DIAGNOSIS — F0281 Dementia in other diseases classified elsewhere with behavioral disturbance: Secondary | ICD-10-CM | POA: Diagnosis not present

## 2020-08-08 DIAGNOSIS — Z0000 Encounter for general adult medical examination without abnormal findings: Secondary | ICD-10-CM | POA: Diagnosis not present

## 2020-09-30 DIAGNOSIS — H401113 Primary open-angle glaucoma, right eye, severe stage: Secondary | ICD-10-CM | POA: Diagnosis not present

## 2020-09-30 DIAGNOSIS — H401121 Primary open-angle glaucoma, left eye, mild stage: Secondary | ICD-10-CM | POA: Diagnosis not present

## 2020-10-08 DIAGNOSIS — H353132 Nonexudative age-related macular degeneration, bilateral, intermediate dry stage: Secondary | ICD-10-CM | POA: Diagnosis not present

## 2020-10-08 DIAGNOSIS — H401113 Primary open-angle glaucoma, right eye, severe stage: Secondary | ICD-10-CM | POA: Diagnosis not present

## 2020-10-08 DIAGNOSIS — Z961 Presence of intraocular lens: Secondary | ICD-10-CM | POA: Diagnosis not present

## 2020-10-08 DIAGNOSIS — H35363 Drusen (degenerative) of macula, bilateral: Secondary | ICD-10-CM | POA: Diagnosis not present

## 2020-10-08 DIAGNOSIS — H401121 Primary open-angle glaucoma, left eye, mild stage: Secondary | ICD-10-CM | POA: Diagnosis not present

## 2020-11-18 DIAGNOSIS — I77819 Aortic ectasia, unspecified site: Secondary | ICD-10-CM | POA: Diagnosis not present

## 2020-11-18 DIAGNOSIS — Z111 Encounter for screening for respiratory tuberculosis: Secondary | ICD-10-CM | POA: Diagnosis not present

## 2020-11-18 DIAGNOSIS — E1169 Type 2 diabetes mellitus with other specified complication: Secondary | ICD-10-CM | POA: Diagnosis not present

## 2020-11-18 DIAGNOSIS — G309 Alzheimer's disease, unspecified: Secondary | ICD-10-CM | POA: Diagnosis not present

## 2020-11-18 DIAGNOSIS — I10 Essential (primary) hypertension: Secondary | ICD-10-CM | POA: Diagnosis not present

## 2020-12-26 DIAGNOSIS — E119 Type 2 diabetes mellitus without complications: Secondary | ICD-10-CM | POA: Diagnosis not present

## 2020-12-26 DIAGNOSIS — I10 Essential (primary) hypertension: Secondary | ICD-10-CM | POA: Diagnosis not present

## 2020-12-26 DIAGNOSIS — F0280 Dementia in other diseases classified elsewhere without behavioral disturbance: Secondary | ICD-10-CM | POA: Diagnosis not present

## 2020-12-26 DIAGNOSIS — E785 Hyperlipidemia, unspecified: Secondary | ICD-10-CM | POA: Diagnosis not present

## 2020-12-26 DIAGNOSIS — G308 Other Alzheimer's disease: Secondary | ICD-10-CM | POA: Diagnosis not present

## 2020-12-26 DIAGNOSIS — Z8679 Personal history of other diseases of the circulatory system: Secondary | ICD-10-CM | POA: Diagnosis not present

## 2020-12-26 DIAGNOSIS — M1990 Unspecified osteoarthritis, unspecified site: Secondary | ICD-10-CM | POA: Diagnosis not present

## 2020-12-26 DIAGNOSIS — F32A Depression, unspecified: Secondary | ICD-10-CM | POA: Diagnosis not present

## 2020-12-26 DIAGNOSIS — H409 Unspecified glaucoma: Secondary | ICD-10-CM | POA: Diagnosis not present

## 2020-12-31 DIAGNOSIS — I10 Essential (primary) hypertension: Secondary | ICD-10-CM | POA: Diagnosis not present

## 2020-12-31 DIAGNOSIS — E039 Hypothyroidism, unspecified: Secondary | ICD-10-CM | POA: Diagnosis not present

## 2020-12-31 DIAGNOSIS — D508 Other iron deficiency anemias: Secondary | ICD-10-CM | POA: Diagnosis not present

## 2020-12-31 DIAGNOSIS — D519 Vitamin B12 deficiency anemia, unspecified: Secondary | ICD-10-CM | POA: Diagnosis not present

## 2020-12-31 DIAGNOSIS — E559 Vitamin D deficiency, unspecified: Secondary | ICD-10-CM | POA: Diagnosis not present

## 2020-12-31 DIAGNOSIS — E785 Hyperlipidemia, unspecified: Secondary | ICD-10-CM | POA: Diagnosis not present

## 2020-12-31 DIAGNOSIS — E08311 Diabetes mellitus due to underlying condition with unspecified diabetic retinopathy with macular edema: Secondary | ICD-10-CM | POA: Diagnosis not present

## 2021-01-02 DIAGNOSIS — H409 Unspecified glaucoma: Secondary | ICD-10-CM | POA: Diagnosis not present

## 2021-01-02 DIAGNOSIS — I10 Essential (primary) hypertension: Secondary | ICD-10-CM | POA: Diagnosis not present

## 2021-01-02 DIAGNOSIS — E875 Hyperkalemia: Secondary | ICD-10-CM | POA: Diagnosis not present

## 2021-01-02 DIAGNOSIS — E119 Type 2 diabetes mellitus without complications: Secondary | ICD-10-CM | POA: Diagnosis not present

## 2021-01-02 DIAGNOSIS — E568 Deficiency of other vitamins: Secondary | ICD-10-CM | POA: Diagnosis not present

## 2021-01-02 DIAGNOSIS — E785 Hyperlipidemia, unspecified: Secondary | ICD-10-CM | POA: Diagnosis not present

## 2021-01-02 DIAGNOSIS — F0280 Dementia in other diseases classified elsewhere without behavioral disturbance: Secondary | ICD-10-CM | POA: Diagnosis not present

## 2021-01-02 DIAGNOSIS — F32A Depression, unspecified: Secondary | ICD-10-CM | POA: Diagnosis not present

## 2021-01-02 DIAGNOSIS — M1388 Other specified arthritis, other site: Secondary | ICD-10-CM | POA: Diagnosis not present

## 2021-01-02 DIAGNOSIS — G309 Alzheimer's disease, unspecified: Secondary | ICD-10-CM | POA: Diagnosis not present

## 2021-01-02 DIAGNOSIS — Z9181 History of falling: Secondary | ICD-10-CM | POA: Diagnosis not present

## 2021-01-07 DIAGNOSIS — E119 Type 2 diabetes mellitus without complications: Secondary | ICD-10-CM | POA: Diagnosis not present

## 2021-01-07 DIAGNOSIS — H409 Unspecified glaucoma: Secondary | ICD-10-CM | POA: Diagnosis not present

## 2021-01-07 DIAGNOSIS — Z9181 History of falling: Secondary | ICD-10-CM | POA: Diagnosis not present

## 2021-01-07 DIAGNOSIS — F0280 Dementia in other diseases classified elsewhere without behavioral disturbance: Secondary | ICD-10-CM | POA: Diagnosis not present

## 2021-01-07 DIAGNOSIS — F32A Depression, unspecified: Secondary | ICD-10-CM | POA: Diagnosis not present

## 2021-01-07 DIAGNOSIS — I10 Essential (primary) hypertension: Secondary | ICD-10-CM | POA: Diagnosis not present

## 2021-01-07 DIAGNOSIS — E559 Vitamin D deficiency, unspecified: Secondary | ICD-10-CM | POA: Diagnosis not present

## 2021-01-07 DIAGNOSIS — G309 Alzheimer's disease, unspecified: Secondary | ICD-10-CM | POA: Diagnosis not present

## 2021-01-07 DIAGNOSIS — E785 Hyperlipidemia, unspecified: Secondary | ICD-10-CM | POA: Diagnosis not present

## 2021-01-08 ENCOUNTER — Other Ambulatory Visit: Payer: Self-pay

## 2021-01-08 ENCOUNTER — Encounter (HOSPITAL_BASED_OUTPATIENT_CLINIC_OR_DEPARTMENT_OTHER): Payer: Self-pay | Admitting: Emergency Medicine

## 2021-01-08 ENCOUNTER — Emergency Department (HOSPITAL_BASED_OUTPATIENT_CLINIC_OR_DEPARTMENT_OTHER)
Admission: EM | Admit: 2021-01-08 | Discharge: 2021-01-08 | Disposition: A | Discharge status: Home / Self Care | Payer: Medicare PPO | Attending: Emergency Medicine | Admitting: Emergency Medicine

## 2021-01-08 DIAGNOSIS — F0390 Unspecified dementia without behavioral disturbance: Secondary | ICD-10-CM | POA: Diagnosis not present

## 2021-01-08 DIAGNOSIS — R300 Dysuria: Secondary | ICD-10-CM | POA: Diagnosis present

## 2021-01-08 DIAGNOSIS — B9689 Other specified bacterial agents as the cause of diseases classified elsewhere: Secondary | ICD-10-CM | POA: Insufficient documentation

## 2021-01-08 DIAGNOSIS — I10 Essential (primary) hypertension: Secondary | ICD-10-CM | POA: Insufficient documentation

## 2021-01-08 DIAGNOSIS — N390 Urinary tract infection, site not specified: Secondary | ICD-10-CM | POA: Insufficient documentation

## 2021-01-08 DIAGNOSIS — Z79899 Other long term (current) drug therapy: Secondary | ICD-10-CM | POA: Diagnosis not present

## 2021-01-08 LAB — BASIC METABOLIC PANEL WITH GFR
Anion gap: 7 (ref 5–15)
BUN: 21 mg/dL (ref 8–23)
CO2: 28 mmol/L (ref 22–32)
Calcium: 9.5 mg/dL (ref 8.9–10.3)
Chloride: 106 mmol/L (ref 98–111)
Creatinine, Ser: 0.94 mg/dL (ref 0.44–1.00)
GFR, Estimated: 60 mL/min — ABNORMAL LOW
Glucose, Bld: 99 mg/dL (ref 70–99)
Potassium: 4.2 mmol/L (ref 3.5–5.1)
Sodium: 141 mmol/L (ref 135–145)

## 2021-01-08 LAB — CBC WITH DIFFERENTIAL/PLATELET
Abs Immature Granulocytes: 0.02 K/uL (ref 0.00–0.07)
Basophils Absolute: 0.1 K/uL (ref 0.0–0.1)
Basophils Relative: 1 %
Eosinophils Absolute: 0.5 K/uL (ref 0.0–0.5)
Eosinophils Relative: 6 %
HCT: 39.7 % (ref 36.0–46.0)
Hemoglobin: 12.7 g/dL (ref 12.0–15.0)
Immature Granulocytes: 0 %
Lymphocytes Relative: 28 %
Lymphs Abs: 2.3 K/uL (ref 0.7–4.0)
MCH: 31.7 pg (ref 26.0–34.0)
MCHC: 32 g/dL (ref 30.0–36.0)
MCV: 99 fL (ref 80.0–100.0)
Monocytes Absolute: 0.8 K/uL (ref 0.1–1.0)
Monocytes Relative: 10 %
Neutro Abs: 4.7 K/uL (ref 1.7–7.7)
Neutrophils Relative %: 55 %
Platelets: 250 K/uL (ref 150–400)
RBC: 4.01 MIL/uL (ref 3.87–5.11)
RDW: 13.6 % (ref 11.5–15.5)
WBC: 8.4 K/uL (ref 4.0–10.5)
nRBC: 0 % (ref 0.0–0.2)

## 2021-01-08 LAB — URINALYSIS, ROUTINE W REFLEX MICROSCOPIC
Bilirubin Urine: NEGATIVE
Glucose, UA: NEGATIVE mg/dL
Ketones, ur: NEGATIVE mg/dL
Nitrite: POSITIVE — AB
Specific Gravity, Urine: 1.022 (ref 1.005–1.030)
pH: 6 (ref 5.0–8.0)

## 2021-01-08 MED ORDER — SODIUM CHLORIDE 0.9 % IV BOLUS
1000.0000 mL | Freq: Once | INTRAVENOUS | Status: AC
  Administered 2021-01-08: 1000 mL via INTRAVENOUS

## 2021-01-08 MED ORDER — LEVOFLOXACIN 500 MG PO TABS: 500.0000 mg | ORAL_TABLET | Freq: Every day | ORAL | 0 refills | Status: DC

## 2021-01-08 MED ORDER — LEVOFLOXACIN 500 MG PO TABS
500.0000 mg | ORAL_TABLET | Freq: Once | ORAL | Status: AC
  Administered 2021-01-08: 500 mg via ORAL
  Filled 2021-01-08: qty 1

## 2021-01-08 NOTE — ED Triage Notes (Signed)
Pt presents to ED BIB [facility]. Pt c/o anuria x3d. Pt denies pain or urinary urgency.

## 2021-01-08 NOTE — ED Notes (Signed)
Purewick in Place per RN

## 2021-01-08 NOTE — ED Provider Notes (Signed)
[facility] EMERGENCY DEPT Provider Note   CSN: [id number] Arrival date & time: 01/08/21  1705     History Chief Complaint  Patient presents with   Dysuria    Mary Keller is a 87 y.o. female.  Level 5 caveat secondary to dementia.  87 year old female brought in by EMS for concerns of possible urinary retention.  Patient herself denies any complaints.  Feels she is been urinating normally.  Patient's daughter says she uses diapers but sometimes can get go to the bathroom on her own.  She said that staff was concerned that she had not urinated in 3 days.  Eating and drinking normally.  No fevers. Her brief is wet here on arrival.   The history is provided by the patient, a relative and the EMS personnel.  Dysuria Pain quality:  Unable to specify Pain severity:  No pain Onset quality:  Gradual Duration:  3 days Timing:  Unable to specify Progression:  Unchanged Chronicity:  New Recent urinary tract infections: no   Relieved by:  None tried Worsened by:  Nothing Ineffective treatments:  None tried Urinary symptoms: incontinence   Urinary symptoms: no foul-smelling urine and no hematuria   Associated symptoms: no abdominal pain and no fever   Risk factors: no urinary catheter       Past Medical History:  Diagnosis Date   Dementia (HCC)    Depression    Glaucoma    Hypercholesteremia    Hypertension     Patient Active Problem List   Diagnosis Date Noted   Primary open-angle glaucoma 07/03/2016   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     Family History  Family history unknown: Yes    Social History   Tobacco Use   Smoking status: Never  Vaping Use   Vaping Use: Never used  Substance Use Topics   Alcohol use: Not  Currently   Drug use: Never    Home Medications Prior to Admission medications   Medication Sig Start Date End Date Taking? Authorizing Provider  atorvastatin (LIPITOR) 40 MG tablet Take 40 mg by mouth. 08/28/10   [provider]  Brimonidine Tartrate-Timolol (COMBIGAN OP) Apply to eye. Both eyes    [provider]  brinzolamide (AZOPT) 1 % ophthalmic suspension 1 drop 3 (three) times daily. Right eye only    [provider]  diphenhydrAMINE (BENADRYL) 25 MG tablet Take 1 tablet (25 mg total) by mouth every 6 (six) hours as needed for up to 2 doses. 04/22/20   Merrilee Jansky, MD  escitalopram (LEXAPRO) 20 MG tablet Take by mouth. 10/15/16   [provider]  famotidine (PEPCID) 20 MG tablet Take 1 tablet (20 mg total) by mouth 2 (two) times daily for 3 days. 04/22/20 04/25/20  Merrilee Jansky, MD  ibuprofen (ADVIL,MOTRIN) 200 MG tablet Take by mouth. 08/28/10   [provider]  latanoprost (XALATAN) 0.005 % ophthalmic solution 1 drop at bedtime. Both eyes    [provider]  memantine (NAMENDA) 5 MG tablet Take 5 mg by mouth 2 (two) times daily.    [provider]  pilocarpine (PILOCAR) 4 % ophthalmic solution 1 drop 4 (four) times daily. Right eye only    [provider]  ramipril (  ALTACE) 5 MG capsule Take 5 mg by mouth once. 08/28/10   [provider]    Allergies    Penicillins and Sulfamethoxazole  Review of Systems   Review of Systems  Unable to perform ROS: Dementia  Constitutional:  Negative for fever.  Gastrointestinal:  Negative for abdominal pain.  Genitourinary:  Positive for decreased urine volume.   Physical Exam Updated Vital Signs BP 128/82 (BP Location: Right Arm)   Pulse (!) 53   Temp 98.6 F (37 C) (Oral)   Resp 18   Ht 5\' 3"  (1.6 m)   Wt 56.5 kg   SpO2 95%   BMI 22.07 kg/m   Physical Exam Vitals and nursing note reviewed.  Constitutional:      General: She is not in acute  distress.    Appearance: Normal appearance. She is well-developed.  HENT:     Head: Normocephalic and atraumatic.  Eyes:     Conjunctiva/sclera: Conjunctivae normal.  Cardiovascular:     Rate and Rhythm: Normal rate and regular rhythm.     Heart sounds: No murmur heard. Pulmonary:     Effort: Pulmonary effort is normal. No respiratory distress.     Breath sounds: Normal breath sounds.  Abdominal:     Palpations: Abdomen is soft.     Tenderness: There is no abdominal tenderness. There is no guarding or rebound.  Musculoskeletal:        General: Normal range of motion.     Cervical back: Neck supple.     Right lower leg: No edema.     Left lower leg: No edema.  Skin:    General: Skin is warm and dry.  Neurological:     Mental Status: She is alert. Mental status is at baseline.    ED Results / Procedures / Treatments   Labs (all labs ordered are listed, but only abnormal results are displayed) Labs Reviewed  BASIC METABOLIC PANEL - Abnormal; Notable for the following components:      Result Value   GFR, Estimated 60 (*)    All other components within normal limits  URINALYSIS, ROUTINE W REFLEX MICROSCOPIC - Abnormal; Notable for the following components:   Hgb urine dipstick TRACE (*)    Protein, ur TRACE (*)    Nitrite POSITIVE (*)    Leukocytes,Ua MODERATE (*)    Bacteria, UA RARE (*)    All other components within normal limits  URINE CULTURE  CBC WITH DIFFERENTIAL/PLATELET    EKG None  Radiology No results found.  Procedures Procedures   Medications Ordered in ED Medications  sodium chloride 0.9 % bolus 1,000 mL (0 mLs Intravenous Stopped 01/08/21 2044)  levofloxacin (LEVAQUIN) tablet 500 mg (500 mg Oral Given 01/08/21 2115)    ED Course  I have reviewed the triage vital signs and the nursing notes.  Pertinent labs & imaging results that were available during my care of the patient were reviewed by me and considered in my medical decision making (see  chart for details).  Clinical Course as of 01/09/21 0950  Wed Jan 08, 2021  2059 Patient and daughter do not know what her penicillin allergy is.  I recommended that we start the patient on Keflex but the daughter is uncomfortable that she might have a reaction to it.  We will put on Levaquin. [MB]    Clinical Course User Index [MB] Jan 10, 2021, MD   MDM Rules/Calculators/A&P  This patient complains of decreased urination; this involves an extensive number of treatment Options and is a complaint that carries with it a high risk of complications and Morbidity. The differential includes urinary retention, UTI dehydration, metabolic derangement  I ordered, reviewed and interpreted labs, which included CBC with normal white count normal hemoglobin, chemistries normal with normal renal function, urinalysis nitrite positive with some white cells consistent with infection I ordered medication oral antibiotics and IV fluids I ordered imaging studies which included bladder scan and I independently    visualized and interpreted imaging which showed only about 80 or 90 cc Additional history obtained from patient's daughter Previous records obtained and reviewed in epic, no recent admissions   After the interventions stated above, I reevaluated the patient and found patient to be hemodynamically stable.  Reviewed results of work-up with her and her daughter.  Daughter is comfortable with her returning back to facility.  She is allergic to penicillin and Bactrim.  Daughter hesitant to have her on Keflex.  Will place on Levaquin.  Urine culture sent.   Final Clinical Impression(s) / ED Diagnoses Final diagnoses:  Urinary tract infection without hematuria, site unspecified    Rx / DC Orders ED Discharge Orders          Ordered    levofloxacin (LEVAQUIN) 500 MG tablet  Daily,   Status:  Discontinued        01/08/21 2101    levofloxacin (LEVAQUIN) 500 MG tablet  Daily         01/08/21 2124             Terrilee Files, MD 01/09/21 [phone]

## 2021-01-08 NOTE — Discharge Instructions (Addendum)
You were seen in the emergency department for urinary symptoms.  Your lab work was unremarkable but your urine showed signs of infection.  We are prescribing you an antibiotic.  Take once daily.  Follow-up with your primary care doctor.  Drink plenty of fluids.  Return to the emergency department for any worsening or concerning symptoms

## 2021-01-09 DIAGNOSIS — N390 Urinary tract infection, site not specified: Secondary | ICD-10-CM | POA: Diagnosis not present

## 2021-01-09 DIAGNOSIS — E119 Type 2 diabetes mellitus without complications: Secondary | ICD-10-CM | POA: Diagnosis not present

## 2021-01-09 DIAGNOSIS — E875 Hyperkalemia: Secondary | ICD-10-CM | POA: Diagnosis not present

## 2021-01-09 DIAGNOSIS — R4189 Other symptoms and signs involving cognitive functions and awareness: Secondary | ICD-10-CM | POA: Diagnosis not present

## 2021-01-09 DIAGNOSIS — G308 Other Alzheimer's disease: Secondary | ICD-10-CM | POA: Diagnosis not present

## 2021-01-09 DIAGNOSIS — F0280 Dementia in other diseases classified elsewhere without behavioral disturbance: Secondary | ICD-10-CM | POA: Diagnosis not present

## 2021-01-10 DIAGNOSIS — G309 Alzheimer's disease, unspecified: Secondary | ICD-10-CM | POA: Diagnosis not present

## 2021-01-10 DIAGNOSIS — H409 Unspecified glaucoma: Secondary | ICD-10-CM | POA: Diagnosis not present

## 2021-01-10 DIAGNOSIS — F32A Depression, unspecified: Secondary | ICD-10-CM | POA: Diagnosis not present

## 2021-01-10 DIAGNOSIS — I10 Essential (primary) hypertension: Secondary | ICD-10-CM | POA: Diagnosis not present

## 2021-01-10 DIAGNOSIS — E785 Hyperlipidemia, unspecified: Secondary | ICD-10-CM | POA: Diagnosis not present

## 2021-01-10 DIAGNOSIS — Z9181 History of falling: Secondary | ICD-10-CM | POA: Diagnosis not present

## 2021-01-10 DIAGNOSIS — E119 Type 2 diabetes mellitus without complications: Secondary | ICD-10-CM | POA: Diagnosis not present

## 2021-01-10 DIAGNOSIS — F0280 Dementia in other diseases classified elsewhere without behavioral disturbance: Secondary | ICD-10-CM | POA: Diagnosis not present

## 2021-01-11 LAB — URINE CULTURE: Culture: >=100000 — AB

## 2021-01-12 ENCOUNTER — Telehealth: Payer: Self-pay | Admitting: Emergency Medicine

## 2021-01-12 NOTE — Telephone Encounter (Signed)
Post ED Visit - Positive Culture Follow-up  Culture report reviewed by antimicrobial stewardship pharmacist: [facility] []  , Pharm.D. []  Enzo Bi, Pharm.D., BCPS AQ-ID []  , Pharm.D., BCPS []  Celedonio Miyamoto, Pharm.D., BCPS []  [place], Garvin Fila.D., BCPS, AAHIVP []  , Pharm.D., BCPS, AAHIVP []  Georgina Pillion, PharmD, BCPS []  , PharmD, BCPS []  [place], PharmD, BCPS [x]  [place], PharmD []  , PharmD, BCPS []  Estella Husk, PharmD  Pharmacy Team []  Lysle Pearl, PharmD []  , PharmD []  Phillips Climes, PharmD []  , Rph []  Agapito Games) , PharmD []  Delmar Landau, PharmD []  , PharmD []  Mervyn Gay, PharmD []  , PharmD []  Vinnie Level, PharmD []  [facility], PharmD []  , PharmD []  Len Childs, PharmD   Positive urine culture Treated with Levofloxacin, organism sensitive to the same and no further patient follow-up is required at this time.  Frederico Gerling 01/12/2021, 6:12 PM

## 2021-01-15 DIAGNOSIS — F32A Depression, unspecified: Secondary | ICD-10-CM | POA: Diagnosis not present

## 2021-01-15 DIAGNOSIS — F0280 Dementia in other diseases classified elsewhere without behavioral disturbance: Secondary | ICD-10-CM | POA: Diagnosis not present

## 2021-01-15 DIAGNOSIS — G309 Alzheimer's disease, unspecified: Secondary | ICD-10-CM | POA: Diagnosis not present

## 2021-01-15 DIAGNOSIS — Z9181 History of falling: Secondary | ICD-10-CM | POA: Diagnosis not present

## 2021-01-15 DIAGNOSIS — E119 Type 2 diabetes mellitus without complications: Secondary | ICD-10-CM | POA: Diagnosis not present

## 2021-01-15 DIAGNOSIS — H409 Unspecified glaucoma: Secondary | ICD-10-CM | POA: Diagnosis not present

## 2021-01-15 DIAGNOSIS — I10 Essential (primary) hypertension: Secondary | ICD-10-CM | POA: Diagnosis not present

## 2021-01-15 DIAGNOSIS — E785 Hyperlipidemia, unspecified: Secondary | ICD-10-CM | POA: Diagnosis not present

## 2021-01-17 DIAGNOSIS — I10 Essential (primary) hypertension: Secondary | ICD-10-CM | POA: Diagnosis not present

## 2021-01-17 DIAGNOSIS — F0280 Dementia in other diseases classified elsewhere without behavioral disturbance: Secondary | ICD-10-CM | POA: Diagnosis not present

## 2021-01-17 DIAGNOSIS — Z9181 History of falling: Secondary | ICD-10-CM | POA: Diagnosis not present

## 2021-01-17 DIAGNOSIS — E119 Type 2 diabetes mellitus without complications: Secondary | ICD-10-CM | POA: Diagnosis not present

## 2021-01-17 DIAGNOSIS — E785 Hyperlipidemia, unspecified: Secondary | ICD-10-CM | POA: Diagnosis not present

## 2021-01-17 DIAGNOSIS — F32A Depression, unspecified: Secondary | ICD-10-CM | POA: Diagnosis not present

## 2021-01-17 DIAGNOSIS — G309 Alzheimer's disease, unspecified: Secondary | ICD-10-CM | POA: Diagnosis not present

## 2021-01-17 DIAGNOSIS — H409 Unspecified glaucoma: Secondary | ICD-10-CM | POA: Diagnosis not present

## 2021-01-23 DIAGNOSIS — E119 Type 2 diabetes mellitus without complications: Secondary | ICD-10-CM | POA: Diagnosis not present

## 2021-01-23 DIAGNOSIS — H409 Unspecified glaucoma: Secondary | ICD-10-CM | POA: Diagnosis not present

## 2021-01-23 DIAGNOSIS — G309 Alzheimer's disease, unspecified: Secondary | ICD-10-CM | POA: Diagnosis not present

## 2021-01-23 DIAGNOSIS — F32A Depression, unspecified: Secondary | ICD-10-CM | POA: Diagnosis not present

## 2021-01-23 DIAGNOSIS — F0280 Dementia in other diseases classified elsewhere without behavioral disturbance: Secondary | ICD-10-CM | POA: Diagnosis not present

## 2021-01-23 DIAGNOSIS — E785 Hyperlipidemia, unspecified: Secondary | ICD-10-CM | POA: Diagnosis not present

## 2021-01-23 DIAGNOSIS — Z9181 History of falling: Secondary | ICD-10-CM | POA: Diagnosis not present

## 2021-01-23 DIAGNOSIS — I10 Essential (primary) hypertension: Secondary | ICD-10-CM | POA: Diagnosis not present

## 2021-01-24 DIAGNOSIS — F0280 Dementia in other diseases classified elsewhere without behavioral disturbance: Secondary | ICD-10-CM | POA: Diagnosis not present

## 2021-01-24 DIAGNOSIS — E119 Type 2 diabetes mellitus without complications: Secondary | ICD-10-CM | POA: Diagnosis not present

## 2021-01-24 DIAGNOSIS — H409 Unspecified glaucoma: Secondary | ICD-10-CM | POA: Diagnosis not present

## 2021-01-24 DIAGNOSIS — G309 Alzheimer's disease, unspecified: Secondary | ICD-10-CM | POA: Diagnosis not present

## 2021-01-24 DIAGNOSIS — F32A Depression, unspecified: Secondary | ICD-10-CM | POA: Diagnosis not present

## 2021-01-24 DIAGNOSIS — Z9181 History of falling: Secondary | ICD-10-CM | POA: Diagnosis not present

## 2021-01-24 DIAGNOSIS — E785 Hyperlipidemia, unspecified: Secondary | ICD-10-CM | POA: Diagnosis not present

## 2021-01-24 DIAGNOSIS — I10 Essential (primary) hypertension: Secondary | ICD-10-CM | POA: Diagnosis not present

## 2021-01-27 DIAGNOSIS — H401121 Primary open-angle glaucoma, left eye, mild stage: Secondary | ICD-10-CM | POA: Diagnosis not present

## 2021-01-27 DIAGNOSIS — H401113 Primary open-angle glaucoma, right eye, severe stage: Secondary | ICD-10-CM | POA: Diagnosis not present

## 2021-01-28 DIAGNOSIS — I10 Essential (primary) hypertension: Secondary | ICD-10-CM | POA: Diagnosis not present

## 2021-01-28 DIAGNOSIS — E785 Hyperlipidemia, unspecified: Secondary | ICD-10-CM | POA: Diagnosis not present

## 2021-01-28 DIAGNOSIS — E119 Type 2 diabetes mellitus without complications: Secondary | ICD-10-CM | POA: Diagnosis not present

## 2021-01-28 DIAGNOSIS — F32A Depression, unspecified: Secondary | ICD-10-CM | POA: Diagnosis not present

## 2021-01-28 DIAGNOSIS — Z9181 History of falling: Secondary | ICD-10-CM | POA: Diagnosis not present

## 2021-01-28 DIAGNOSIS — H409 Unspecified glaucoma: Secondary | ICD-10-CM | POA: Diagnosis not present

## 2021-01-28 DIAGNOSIS — F0280 Dementia in other diseases classified elsewhere without behavioral disturbance: Secondary | ICD-10-CM | POA: Diagnosis not present

## 2021-01-28 DIAGNOSIS — G309 Alzheimer's disease, unspecified: Secondary | ICD-10-CM | POA: Diagnosis not present

## 2021-01-29 DIAGNOSIS — F0280 Dementia in other diseases classified elsewhere without behavioral disturbance: Secondary | ICD-10-CM | POA: Diagnosis not present

## 2021-01-29 DIAGNOSIS — Z9181 History of falling: Secondary | ICD-10-CM | POA: Diagnosis not present

## 2021-01-29 DIAGNOSIS — E119 Type 2 diabetes mellitus without complications: Secondary | ICD-10-CM | POA: Diagnosis not present

## 2021-01-29 DIAGNOSIS — G309 Alzheimer's disease, unspecified: Secondary | ICD-10-CM | POA: Diagnosis not present

## 2021-01-29 DIAGNOSIS — H409 Unspecified glaucoma: Secondary | ICD-10-CM | POA: Diagnosis not present

## 2021-01-29 DIAGNOSIS — F32A Depression, unspecified: Secondary | ICD-10-CM | POA: Diagnosis not present

## 2021-01-29 DIAGNOSIS — I10 Essential (primary) hypertension: Secondary | ICD-10-CM | POA: Diagnosis not present

## 2021-01-29 DIAGNOSIS — E785 Hyperlipidemia, unspecified: Secondary | ICD-10-CM | POA: Diagnosis not present

## 2021-01-30 DIAGNOSIS — F32A Depression, unspecified: Secondary | ICD-10-CM | POA: Diagnosis not present

## 2021-01-30 DIAGNOSIS — Z9181 History of falling: Secondary | ICD-10-CM | POA: Diagnosis not present

## 2021-01-30 DIAGNOSIS — E119 Type 2 diabetes mellitus without complications: Secondary | ICD-10-CM | POA: Diagnosis not present

## 2021-01-30 DIAGNOSIS — F0280 Dementia in other diseases classified elsewhere without behavioral disturbance: Secondary | ICD-10-CM | POA: Diagnosis not present

## 2021-01-30 DIAGNOSIS — E785 Hyperlipidemia, unspecified: Secondary | ICD-10-CM | POA: Diagnosis not present

## 2021-01-30 DIAGNOSIS — H409 Unspecified glaucoma: Secondary | ICD-10-CM | POA: Diagnosis not present

## 2021-01-30 DIAGNOSIS — G309 Alzheimer's disease, unspecified: Secondary | ICD-10-CM | POA: Diagnosis not present

## 2021-01-30 DIAGNOSIS — I10 Essential (primary) hypertension: Secondary | ICD-10-CM | POA: Diagnosis not present

## 2021-01-31 DIAGNOSIS — H409 Unspecified glaucoma: Secondary | ICD-10-CM | POA: Diagnosis not present

## 2021-01-31 DIAGNOSIS — E785 Hyperlipidemia, unspecified: Secondary | ICD-10-CM | POA: Diagnosis not present

## 2021-01-31 DIAGNOSIS — E119 Type 2 diabetes mellitus without complications: Secondary | ICD-10-CM | POA: Diagnosis not present

## 2021-01-31 DIAGNOSIS — F0280 Dementia in other diseases classified elsewhere without behavioral disturbance: Secondary | ICD-10-CM | POA: Diagnosis not present

## 2021-01-31 DIAGNOSIS — Z9181 History of falling: Secondary | ICD-10-CM | POA: Diagnosis not present

## 2021-01-31 DIAGNOSIS — F32A Depression, unspecified: Secondary | ICD-10-CM | POA: Diagnosis not present

## 2021-01-31 DIAGNOSIS — I10 Essential (primary) hypertension: Secondary | ICD-10-CM | POA: Diagnosis not present

## 2021-01-31 DIAGNOSIS — G309 Alzheimer's disease, unspecified: Secondary | ICD-10-CM | POA: Diagnosis not present

## 2021-02-06 DIAGNOSIS — E119 Type 2 diabetes mellitus without complications: Secondary | ICD-10-CM | POA: Diagnosis not present

## 2021-02-06 DIAGNOSIS — F32A Depression, unspecified: Secondary | ICD-10-CM | POA: Diagnosis not present

## 2021-02-06 DIAGNOSIS — F0280 Dementia in other diseases classified elsewhere without behavioral disturbance: Secondary | ICD-10-CM | POA: Diagnosis not present

## 2021-02-06 DIAGNOSIS — H409 Unspecified glaucoma: Secondary | ICD-10-CM | POA: Diagnosis not present

## 2021-02-06 DIAGNOSIS — Z9181 History of falling: Secondary | ICD-10-CM | POA: Diagnosis not present

## 2021-02-06 DIAGNOSIS — I10 Essential (primary) hypertension: Secondary | ICD-10-CM | POA: Diagnosis not present

## 2021-02-06 DIAGNOSIS — E785 Hyperlipidemia, unspecified: Secondary | ICD-10-CM | POA: Diagnosis not present

## 2021-02-06 DIAGNOSIS — G309 Alzheimer's disease, unspecified: Secondary | ICD-10-CM | POA: Diagnosis not present

## 2021-02-11 DIAGNOSIS — G309 Alzheimer's disease, unspecified: Secondary | ICD-10-CM | POA: Diagnosis not present

## 2021-02-11 DIAGNOSIS — Z9181 History of falling: Secondary | ICD-10-CM | POA: Diagnosis not present

## 2021-02-11 DIAGNOSIS — E119 Type 2 diabetes mellitus without complications: Secondary | ICD-10-CM | POA: Diagnosis not present

## 2021-02-11 DIAGNOSIS — F32A Depression, unspecified: Secondary | ICD-10-CM | POA: Diagnosis not present

## 2021-02-11 DIAGNOSIS — E785 Hyperlipidemia, unspecified: Secondary | ICD-10-CM | POA: Diagnosis not present

## 2021-02-11 DIAGNOSIS — F0280 Dementia in other diseases classified elsewhere without behavioral disturbance: Secondary | ICD-10-CM | POA: Diagnosis not present

## 2021-02-11 DIAGNOSIS — H409 Unspecified glaucoma: Secondary | ICD-10-CM | POA: Diagnosis not present

## 2021-02-11 DIAGNOSIS — I10 Essential (primary) hypertension: Secondary | ICD-10-CM | POA: Diagnosis not present

## 2021-02-12 DIAGNOSIS — E119 Type 2 diabetes mellitus without complications: Secondary | ICD-10-CM | POA: Diagnosis not present

## 2021-02-12 DIAGNOSIS — F32A Depression, unspecified: Secondary | ICD-10-CM | POA: Diagnosis not present

## 2021-02-12 DIAGNOSIS — Z9181 History of falling: Secondary | ICD-10-CM | POA: Diagnosis not present

## 2021-02-12 DIAGNOSIS — G309 Alzheimer's disease, unspecified: Secondary | ICD-10-CM | POA: Diagnosis not present

## 2021-02-12 DIAGNOSIS — I10 Essential (primary) hypertension: Secondary | ICD-10-CM | POA: Diagnosis not present

## 2021-02-12 DIAGNOSIS — H409 Unspecified glaucoma: Secondary | ICD-10-CM | POA: Diagnosis not present

## 2021-02-12 DIAGNOSIS — F0280 Dementia in other diseases classified elsewhere without behavioral disturbance: Secondary | ICD-10-CM | POA: Diagnosis not present

## 2021-02-12 DIAGNOSIS — E785 Hyperlipidemia, unspecified: Secondary | ICD-10-CM | POA: Diagnosis not present

## 2021-02-17 DIAGNOSIS — Z9181 History of falling: Secondary | ICD-10-CM | POA: Diagnosis not present

## 2021-02-17 DIAGNOSIS — F0280 Dementia in other diseases classified elsewhere without behavioral disturbance: Secondary | ICD-10-CM | POA: Diagnosis not present

## 2021-02-17 DIAGNOSIS — I10 Essential (primary) hypertension: Secondary | ICD-10-CM | POA: Diagnosis not present

## 2021-02-17 DIAGNOSIS — G309 Alzheimer's disease, unspecified: Secondary | ICD-10-CM | POA: Diagnosis not present

## 2021-02-17 DIAGNOSIS — E119 Type 2 diabetes mellitus without complications: Secondary | ICD-10-CM | POA: Diagnosis not present

## 2021-02-17 DIAGNOSIS — E785 Hyperlipidemia, unspecified: Secondary | ICD-10-CM | POA: Diagnosis not present

## 2021-02-17 DIAGNOSIS — H409 Unspecified glaucoma: Secondary | ICD-10-CM | POA: Diagnosis not present

## 2021-02-17 DIAGNOSIS — F32A Depression, unspecified: Secondary | ICD-10-CM | POA: Diagnosis not present

## 2021-02-20 DIAGNOSIS — E559 Vitamin D deficiency, unspecified: Secondary | ICD-10-CM | POA: Diagnosis not present

## 2021-02-26 DIAGNOSIS — I10 Essential (primary) hypertension: Secondary | ICD-10-CM | POA: Diagnosis not present

## 2021-02-26 DIAGNOSIS — F32A Depression, unspecified: Secondary | ICD-10-CM | POA: Diagnosis not present

## 2021-02-26 DIAGNOSIS — Z9181 History of falling: Secondary | ICD-10-CM | POA: Diagnosis not present

## 2021-02-26 DIAGNOSIS — H409 Unspecified glaucoma: Secondary | ICD-10-CM | POA: Diagnosis not present

## 2021-02-26 DIAGNOSIS — F0280 Dementia in other diseases classified elsewhere without behavioral disturbance: Secondary | ICD-10-CM | POA: Diagnosis not present

## 2021-02-26 DIAGNOSIS — E785 Hyperlipidemia, unspecified: Secondary | ICD-10-CM | POA: Diagnosis not present

## 2021-02-26 DIAGNOSIS — E119 Type 2 diabetes mellitus without complications: Secondary | ICD-10-CM | POA: Diagnosis not present

## 2021-02-26 DIAGNOSIS — G309 Alzheimer's disease, unspecified: Secondary | ICD-10-CM | POA: Diagnosis not present

## 2021-03-27 DIAGNOSIS — I10 Essential (primary) hypertension: Secondary | ICD-10-CM | POA: Diagnosis not present

## 2021-03-27 DIAGNOSIS — G309 Alzheimer's disease, unspecified: Secondary | ICD-10-CM | POA: Diagnosis not present

## 2021-03-27 DIAGNOSIS — E119 Type 2 diabetes mellitus without complications: Secondary | ICD-10-CM | POA: Diagnosis not present

## 2021-03-27 DIAGNOSIS — E559 Vitamin D deficiency, unspecified: Secondary | ICD-10-CM | POA: Diagnosis not present

## 2021-03-27 DIAGNOSIS — E785 Hyperlipidemia, unspecified: Secondary | ICD-10-CM | POA: Diagnosis not present

## 2021-03-27 DIAGNOSIS — M1990 Unspecified osteoarthritis, unspecified site: Secondary | ICD-10-CM | POA: Diagnosis not present

## 2021-04-03 DIAGNOSIS — G309 Alzheimer's disease, unspecified: Secondary | ICD-10-CM | POA: Diagnosis not present

## 2021-04-03 DIAGNOSIS — R630 Anorexia: Secondary | ICD-10-CM | POA: Diagnosis not present

## 2021-04-03 DIAGNOSIS — U071 COVID-19: Secondary | ICD-10-CM | POA: Diagnosis not present

## 2021-04-04 DIAGNOSIS — E119 Type 2 diabetes mellitus without complications: Secondary | ICD-10-CM | POA: Diagnosis not present

## 2021-04-04 DIAGNOSIS — F0280 Dementia in other diseases classified elsewhere without behavioral disturbance: Secondary | ICD-10-CM | POA: Diagnosis not present

## 2021-04-04 DIAGNOSIS — U071 COVID-19: Secondary | ICD-10-CM | POA: Diagnosis not present

## 2021-04-04 DIAGNOSIS — G309 Alzheimer's disease, unspecified: Secondary | ICD-10-CM | POA: Diagnosis not present

## 2021-04-04 DIAGNOSIS — Z9181 History of falling: Secondary | ICD-10-CM | POA: Diagnosis not present

## 2021-04-04 DIAGNOSIS — I493 Ventricular premature depolarization: Secondary | ICD-10-CM | POA: Diagnosis not present

## 2021-04-07 DIAGNOSIS — U071 COVID-19: Secondary | ICD-10-CM | POA: Diagnosis not present

## 2021-04-07 DIAGNOSIS — E119 Type 2 diabetes mellitus without complications: Secondary | ICD-10-CM | POA: Diagnosis not present

## 2021-04-07 DIAGNOSIS — Z9181 History of falling: Secondary | ICD-10-CM | POA: Diagnosis not present

## 2021-04-07 DIAGNOSIS — I493 Ventricular premature depolarization: Secondary | ICD-10-CM | POA: Diagnosis not present

## 2021-04-07 DIAGNOSIS — G309 Alzheimer's disease, unspecified: Secondary | ICD-10-CM | POA: Diagnosis not present

## 2021-04-07 DIAGNOSIS — F0280 Dementia in other diseases classified elsewhere without behavioral disturbance: Secondary | ICD-10-CM | POA: Diagnosis not present

## 2021-04-08 DIAGNOSIS — U071 COVID-19: Secondary | ICD-10-CM | POA: Diagnosis not present

## 2021-04-08 DIAGNOSIS — E559 Vitamin D deficiency, unspecified: Secondary | ICD-10-CM | POA: Diagnosis not present

## 2021-04-08 DIAGNOSIS — F0280 Dementia in other diseases classified elsewhere without behavioral disturbance: Secondary | ICD-10-CM | POA: Diagnosis not present

## 2021-04-08 DIAGNOSIS — E119 Type 2 diabetes mellitus without complications: Secondary | ICD-10-CM | POA: Diagnosis not present

## 2021-04-08 DIAGNOSIS — I493 Ventricular premature depolarization: Secondary | ICD-10-CM | POA: Diagnosis not present

## 2021-04-08 DIAGNOSIS — Z9181 History of falling: Secondary | ICD-10-CM | POA: Diagnosis not present

## 2021-04-08 DIAGNOSIS — G309 Alzheimer's disease, unspecified: Secondary | ICD-10-CM | POA: Diagnosis not present

## 2021-04-09 DIAGNOSIS — U071 COVID-19: Secondary | ICD-10-CM | POA: Diagnosis not present

## 2021-04-09 DIAGNOSIS — Z9181 History of falling: Secondary | ICD-10-CM | POA: Diagnosis not present

## 2021-04-09 DIAGNOSIS — E119 Type 2 diabetes mellitus without complications: Secondary | ICD-10-CM | POA: Diagnosis not present

## 2021-04-09 DIAGNOSIS — G309 Alzheimer's disease, unspecified: Secondary | ICD-10-CM | POA: Diagnosis not present

## 2021-04-09 DIAGNOSIS — I493 Ventricular premature depolarization: Secondary | ICD-10-CM | POA: Diagnosis not present

## 2021-04-09 DIAGNOSIS — F0280 Dementia in other diseases classified elsewhere without behavioral disturbance: Secondary | ICD-10-CM | POA: Diagnosis not present

## 2021-04-10 DIAGNOSIS — U071 COVID-19: Secondary | ICD-10-CM | POA: Diagnosis not present

## 2021-04-10 DIAGNOSIS — G309 Alzheimer's disease, unspecified: Secondary | ICD-10-CM | POA: Diagnosis not present

## 2021-04-10 DIAGNOSIS — R059 Cough, unspecified: Secondary | ICD-10-CM | POA: Diagnosis not present

## 2021-04-10 DIAGNOSIS — I493 Ventricular premature depolarization: Secondary | ICD-10-CM | POA: Diagnosis not present

## 2021-04-10 DIAGNOSIS — E119 Type 2 diabetes mellitus without complications: Secondary | ICD-10-CM | POA: Diagnosis not present

## 2021-04-10 DIAGNOSIS — Z9181 History of falling: Secondary | ICD-10-CM | POA: Diagnosis not present

## 2021-04-10 DIAGNOSIS — F0280 Dementia in other diseases classified elsewhere without behavioral disturbance: Secondary | ICD-10-CM | POA: Diagnosis not present

## 2021-04-13 DIAGNOSIS — Z9181 History of falling: Secondary | ICD-10-CM | POA: Diagnosis not present

## 2021-04-13 DIAGNOSIS — F0280 Dementia in other diseases classified elsewhere without behavioral disturbance: Secondary | ICD-10-CM | POA: Diagnosis not present

## 2021-04-13 DIAGNOSIS — G309 Alzheimer's disease, unspecified: Secondary | ICD-10-CM | POA: Diagnosis not present

## 2021-04-13 DIAGNOSIS — I493 Ventricular premature depolarization: Secondary | ICD-10-CM | POA: Diagnosis not present

## 2021-04-13 DIAGNOSIS — U071 COVID-19: Secondary | ICD-10-CM | POA: Diagnosis not present

## 2021-04-13 DIAGNOSIS — E119 Type 2 diabetes mellitus without complications: Secondary | ICD-10-CM | POA: Diagnosis not present

## 2021-04-15 DIAGNOSIS — U071 COVID-19: Secondary | ICD-10-CM | POA: Diagnosis not present

## 2021-04-15 DIAGNOSIS — Z9181 History of falling: Secondary | ICD-10-CM | POA: Diagnosis not present

## 2021-04-15 DIAGNOSIS — F0280 Dementia in other diseases classified elsewhere without behavioral disturbance: Secondary | ICD-10-CM | POA: Diagnosis not present

## 2021-04-15 DIAGNOSIS — G309 Alzheimer's disease, unspecified: Secondary | ICD-10-CM | POA: Diagnosis not present

## 2021-04-15 DIAGNOSIS — I493 Ventricular premature depolarization: Secondary | ICD-10-CM | POA: Diagnosis not present

## 2021-04-15 DIAGNOSIS — E559 Vitamin D deficiency, unspecified: Secondary | ICD-10-CM | POA: Diagnosis not present

## 2021-04-15 DIAGNOSIS — E119 Type 2 diabetes mellitus without complications: Secondary | ICD-10-CM | POA: Diagnosis not present

## 2021-04-17 DIAGNOSIS — U071 COVID-19: Secondary | ICD-10-CM | POA: Diagnosis not present

## 2021-04-17 DIAGNOSIS — G309 Alzheimer's disease, unspecified: Secondary | ICD-10-CM | POA: Diagnosis not present

## 2021-04-17 DIAGNOSIS — I493 Ventricular premature depolarization: Secondary | ICD-10-CM | POA: Diagnosis not present

## 2021-04-17 DIAGNOSIS — E119 Type 2 diabetes mellitus without complications: Secondary | ICD-10-CM | POA: Diagnosis not present

## 2021-04-17 DIAGNOSIS — F0280 Dementia in other diseases classified elsewhere without behavioral disturbance: Secondary | ICD-10-CM | POA: Diagnosis not present

## 2021-04-17 DIAGNOSIS — Z9181 History of falling: Secondary | ICD-10-CM | POA: Diagnosis not present

## 2021-04-18 DIAGNOSIS — F0280 Dementia in other diseases classified elsewhere without behavioral disturbance: Secondary | ICD-10-CM | POA: Diagnosis not present

## 2021-04-18 DIAGNOSIS — G309 Alzheimer's disease, unspecified: Secondary | ICD-10-CM | POA: Diagnosis not present

## 2021-04-18 DIAGNOSIS — I493 Ventricular premature depolarization: Secondary | ICD-10-CM | POA: Diagnosis not present

## 2021-04-18 DIAGNOSIS — U071 COVID-19: Secondary | ICD-10-CM | POA: Diagnosis not present

## 2021-04-18 DIAGNOSIS — Z9181 History of falling: Secondary | ICD-10-CM | POA: Diagnosis not present

## 2021-04-18 DIAGNOSIS — E119 Type 2 diabetes mellitus without complications: Secondary | ICD-10-CM | POA: Diagnosis not present

## 2021-04-22 DIAGNOSIS — G309 Alzheimer's disease, unspecified: Secondary | ICD-10-CM | POA: Diagnosis not present

## 2021-04-22 DIAGNOSIS — F0280 Dementia in other diseases classified elsewhere without behavioral disturbance: Secondary | ICD-10-CM | POA: Diagnosis not present

## 2021-04-22 DIAGNOSIS — U071 COVID-19: Secondary | ICD-10-CM | POA: Diagnosis not present

## 2021-04-22 DIAGNOSIS — I493 Ventricular premature depolarization: Secondary | ICD-10-CM | POA: Diagnosis not present

## 2021-04-22 DIAGNOSIS — Z9181 History of falling: Secondary | ICD-10-CM | POA: Diagnosis not present

## 2021-04-22 DIAGNOSIS — E119 Type 2 diabetes mellitus without complications: Secondary | ICD-10-CM | POA: Diagnosis not present

## 2021-04-23 DIAGNOSIS — G309 Alzheimer's disease, unspecified: Secondary | ICD-10-CM | POA: Diagnosis not present

## 2021-04-23 DIAGNOSIS — F0280 Dementia in other diseases classified elsewhere without behavioral disturbance: Secondary | ICD-10-CM | POA: Diagnosis not present

## 2021-04-23 DIAGNOSIS — E119 Type 2 diabetes mellitus without complications: Secondary | ICD-10-CM | POA: Diagnosis not present

## 2021-04-23 DIAGNOSIS — Z9181 History of falling: Secondary | ICD-10-CM | POA: Diagnosis not present

## 2021-04-23 DIAGNOSIS — I493 Ventricular premature depolarization: Secondary | ICD-10-CM | POA: Diagnosis not present

## 2021-04-23 DIAGNOSIS — U071 COVID-19: Secondary | ICD-10-CM | POA: Diagnosis not present

## 2021-04-25 DIAGNOSIS — I493 Ventricular premature depolarization: Secondary | ICD-10-CM | POA: Diagnosis not present

## 2021-04-25 DIAGNOSIS — G309 Alzheimer's disease, unspecified: Secondary | ICD-10-CM | POA: Diagnosis not present

## 2021-04-25 DIAGNOSIS — Z9181 History of falling: Secondary | ICD-10-CM | POA: Diagnosis not present

## 2021-04-25 DIAGNOSIS — E119 Type 2 diabetes mellitus without complications: Secondary | ICD-10-CM | POA: Diagnosis not present

## 2021-04-25 DIAGNOSIS — U071 COVID-19: Secondary | ICD-10-CM | POA: Diagnosis not present

## 2021-04-25 DIAGNOSIS — F0280 Dementia in other diseases classified elsewhere without behavioral disturbance: Secondary | ICD-10-CM | POA: Diagnosis not present

## 2021-04-29 DIAGNOSIS — G309 Alzheimer's disease, unspecified: Secondary | ICD-10-CM | POA: Diagnosis not present

## 2021-04-29 DIAGNOSIS — I493 Ventricular premature depolarization: Secondary | ICD-10-CM | POA: Diagnosis not present

## 2021-04-29 DIAGNOSIS — Z9181 History of falling: Secondary | ICD-10-CM | POA: Diagnosis not present

## 2021-04-29 DIAGNOSIS — U071 COVID-19: Secondary | ICD-10-CM | POA: Diagnosis not present

## 2021-04-29 DIAGNOSIS — F0280 Dementia in other diseases classified elsewhere without behavioral disturbance: Secondary | ICD-10-CM | POA: Diagnosis not present

## 2021-04-29 DIAGNOSIS — E119 Type 2 diabetes mellitus without complications: Secondary | ICD-10-CM | POA: Diagnosis not present

## 2021-05-01 DIAGNOSIS — F0280 Dementia in other diseases classified elsewhere without behavioral disturbance: Secondary | ICD-10-CM | POA: Diagnosis not present

## 2021-05-01 DIAGNOSIS — U071 COVID-19: Secondary | ICD-10-CM | POA: Diagnosis not present

## 2021-05-01 DIAGNOSIS — Z9181 History of falling: Secondary | ICD-10-CM | POA: Diagnosis not present

## 2021-05-01 DIAGNOSIS — I493 Ventricular premature depolarization: Secondary | ICD-10-CM | POA: Diagnosis not present

## 2021-05-01 DIAGNOSIS — G309 Alzheimer's disease, unspecified: Secondary | ICD-10-CM | POA: Diagnosis not present

## 2021-05-01 DIAGNOSIS — E119 Type 2 diabetes mellitus without complications: Secondary | ICD-10-CM | POA: Diagnosis not present

## 2021-05-05 DIAGNOSIS — I493 Ventricular premature depolarization: Secondary | ICD-10-CM | POA: Diagnosis not present

## 2021-05-05 DIAGNOSIS — U071 COVID-19: Secondary | ICD-10-CM | POA: Diagnosis not present

## 2021-05-05 DIAGNOSIS — Z9181 History of falling: Secondary | ICD-10-CM | POA: Diagnosis not present

## 2021-05-05 DIAGNOSIS — G309 Alzheimer's disease, unspecified: Secondary | ICD-10-CM | POA: Diagnosis not present

## 2021-05-05 DIAGNOSIS — E119 Type 2 diabetes mellitus without complications: Secondary | ICD-10-CM | POA: Diagnosis not present

## 2021-05-05 DIAGNOSIS — F0280 Dementia in other diseases classified elsewhere without behavioral disturbance: Secondary | ICD-10-CM | POA: Diagnosis not present

## 2021-05-08 DIAGNOSIS — F0280 Dementia in other diseases classified elsewhere without behavioral disturbance: Secondary | ICD-10-CM | POA: Diagnosis not present

## 2021-05-08 DIAGNOSIS — G309 Alzheimer's disease, unspecified: Secondary | ICD-10-CM | POA: Diagnosis not present

## 2021-05-08 DIAGNOSIS — U071 COVID-19: Secondary | ICD-10-CM | POA: Diagnosis not present

## 2021-05-08 DIAGNOSIS — H401113 Primary open-angle glaucoma, right eye, severe stage: Secondary | ICD-10-CM | POA: Diagnosis not present

## 2021-05-08 DIAGNOSIS — H401121 Primary open-angle glaucoma, left eye, mild stage: Secondary | ICD-10-CM | POA: Diagnosis not present

## 2021-05-08 DIAGNOSIS — I493 Ventricular premature depolarization: Secondary | ICD-10-CM | POA: Diagnosis not present

## 2021-05-08 DIAGNOSIS — Z9181 History of falling: Secondary | ICD-10-CM | POA: Diagnosis not present

## 2021-05-08 DIAGNOSIS — E119 Type 2 diabetes mellitus without complications: Secondary | ICD-10-CM | POA: Diagnosis not present

## 2021-05-12 DIAGNOSIS — R4182 Altered mental status, unspecified: Secondary | ICD-10-CM | POA: Diagnosis not present

## 2021-05-13 DIAGNOSIS — Z9181 History of falling: Secondary | ICD-10-CM | POA: Diagnosis not present

## 2021-05-13 DIAGNOSIS — I493 Ventricular premature depolarization: Secondary | ICD-10-CM | POA: Diagnosis not present

## 2021-05-13 DIAGNOSIS — G309 Alzheimer's disease, unspecified: Secondary | ICD-10-CM | POA: Diagnosis not present

## 2021-05-13 DIAGNOSIS — F0280 Dementia in other diseases classified elsewhere without behavioral disturbance: Secondary | ICD-10-CM | POA: Diagnosis not present

## 2021-05-13 DIAGNOSIS — U071 COVID-19: Secondary | ICD-10-CM | POA: Diagnosis not present

## 2021-05-13 DIAGNOSIS — E119 Type 2 diabetes mellitus without complications: Secondary | ICD-10-CM | POA: Diagnosis not present

## 2021-05-14 DIAGNOSIS — F0280 Dementia in other diseases classified elsewhere without behavioral disturbance: Secondary | ICD-10-CM | POA: Diagnosis not present

## 2021-05-14 DIAGNOSIS — I493 Ventricular premature depolarization: Secondary | ICD-10-CM | POA: Diagnosis not present

## 2021-05-14 DIAGNOSIS — U071 COVID-19: Secondary | ICD-10-CM | POA: Diagnosis not present

## 2021-05-14 DIAGNOSIS — Z9181 History of falling: Secondary | ICD-10-CM | POA: Diagnosis not present

## 2021-05-14 DIAGNOSIS — E119 Type 2 diabetes mellitus without complications: Secondary | ICD-10-CM | POA: Diagnosis not present

## 2021-05-14 DIAGNOSIS — G309 Alzheimer's disease, unspecified: Secondary | ICD-10-CM | POA: Diagnosis not present

## 2021-05-15 DIAGNOSIS — N390 Urinary tract infection, site not specified: Secondary | ICD-10-CM | POA: Diagnosis not present

## 2021-05-15 DIAGNOSIS — E559 Vitamin D deficiency, unspecified: Secondary | ICD-10-CM | POA: Diagnosis not present

## 2021-05-15 DIAGNOSIS — R300 Dysuria: Secondary | ICD-10-CM | POA: Diagnosis not present

## 2021-05-15 DIAGNOSIS — R630 Anorexia: Secondary | ICD-10-CM | POA: Diagnosis not present

## 2021-05-15 DIAGNOSIS — D72829 Elevated white blood cell count, unspecified: Secondary | ICD-10-CM | POA: Diagnosis not present

## 2021-05-22 DIAGNOSIS — U071 COVID-19: Secondary | ICD-10-CM | POA: Diagnosis not present

## 2021-05-22 DIAGNOSIS — E119 Type 2 diabetes mellitus without complications: Secondary | ICD-10-CM | POA: Diagnosis not present

## 2021-05-22 DIAGNOSIS — Z9181 History of falling: Secondary | ICD-10-CM | POA: Diagnosis not present

## 2021-05-22 DIAGNOSIS — F0280 Dementia in other diseases classified elsewhere without behavioral disturbance: Secondary | ICD-10-CM | POA: Diagnosis not present

## 2021-05-22 DIAGNOSIS — G309 Alzheimer's disease, unspecified: Secondary | ICD-10-CM | POA: Diagnosis not present

## 2021-05-22 DIAGNOSIS — I493 Ventricular premature depolarization: Secondary | ICD-10-CM | POA: Diagnosis not present

## 2021-05-28 DIAGNOSIS — I493 Ventricular premature depolarization: Secondary | ICD-10-CM | POA: Diagnosis not present

## 2021-05-28 DIAGNOSIS — G309 Alzheimer's disease, unspecified: Secondary | ICD-10-CM | POA: Diagnosis not present

## 2021-05-28 DIAGNOSIS — U071 COVID-19: Secondary | ICD-10-CM | POA: Diagnosis not present

## 2021-05-28 DIAGNOSIS — E119 Type 2 diabetes mellitus without complications: Secondary | ICD-10-CM | POA: Diagnosis not present

## 2021-05-28 DIAGNOSIS — Z9181 History of falling: Secondary | ICD-10-CM | POA: Diagnosis not present

## 2021-05-28 DIAGNOSIS — F0280 Dementia in other diseases classified elsewhere without behavioral disturbance: Secondary | ICD-10-CM | POA: Diagnosis not present

## 2021-06-06 DIAGNOSIS — G309 Alzheimer's disease, unspecified: Secondary | ICD-10-CM | POA: Diagnosis not present

## 2021-06-06 DIAGNOSIS — R300 Dysuria: Secondary | ICD-10-CM | POA: Diagnosis not present

## 2021-06-06 DIAGNOSIS — R399 Unspecified symptoms and signs involving the genitourinary system: Secondary | ICD-10-CM | POA: Diagnosis not present

## 2021-06-11 DIAGNOSIS — N390 Urinary tract infection, site not specified: Secondary | ICD-10-CM | POA: Diagnosis not present

## 2021-06-12 DIAGNOSIS — E559 Vitamin D deficiency, unspecified: Secondary | ICD-10-CM | POA: Diagnosis not present

## 2021-07-03 DIAGNOSIS — H409 Unspecified glaucoma: Secondary | ICD-10-CM | POA: Diagnosis not present

## 2021-07-03 DIAGNOSIS — I10 Essential (primary) hypertension: Secondary | ICD-10-CM | POA: Diagnosis not present

## 2021-07-03 DIAGNOSIS — F0280 Dementia in other diseases classified elsewhere without behavioral disturbance: Secondary | ICD-10-CM | POA: Diagnosis not present

## 2021-07-03 DIAGNOSIS — G309 Alzheimer's disease, unspecified: Secondary | ICD-10-CM | POA: Diagnosis not present

## 2021-07-03 DIAGNOSIS — E559 Vitamin D deficiency, unspecified: Secondary | ICD-10-CM | POA: Diagnosis not present

## 2021-07-03 DIAGNOSIS — E785 Hyperlipidemia, unspecified: Secondary | ICD-10-CM | POA: Diagnosis not present

## 2021-07-09 DIAGNOSIS — R41841 Cognitive communication deficit: Secondary | ICD-10-CM | POA: Diagnosis not present

## 2021-07-09 DIAGNOSIS — R4701 Aphasia: Secondary | ICD-10-CM | POA: Diagnosis not present

## 2021-07-10 DIAGNOSIS — R4701 Aphasia: Secondary | ICD-10-CM | POA: Diagnosis not present

## 2021-07-10 DIAGNOSIS — R41841 Cognitive communication deficit: Secondary | ICD-10-CM | POA: Diagnosis not present

## 2021-07-11 DIAGNOSIS — R4701 Aphasia: Secondary | ICD-10-CM | POA: Diagnosis not present

## 2021-07-11 DIAGNOSIS — R41841 Cognitive communication deficit: Secondary | ICD-10-CM | POA: Diagnosis not present

## 2021-07-11 DIAGNOSIS — R1312 Dysphagia, oropharyngeal phase: Secondary | ICD-10-CM | POA: Diagnosis not present

## 2021-08-04 IMAGING — DX DG ELBOW COMPLETE 3+V*L*
4 series · 4 of 4 positions shown · non-contrast
Comparison: None.

CLINICAL DATA: Left elbow pain since an injury when this morning.
The patient fell down stairs

EXAM:
LEFT ELBOW - COMPLETE 3+ VIEW

[elbow ap]
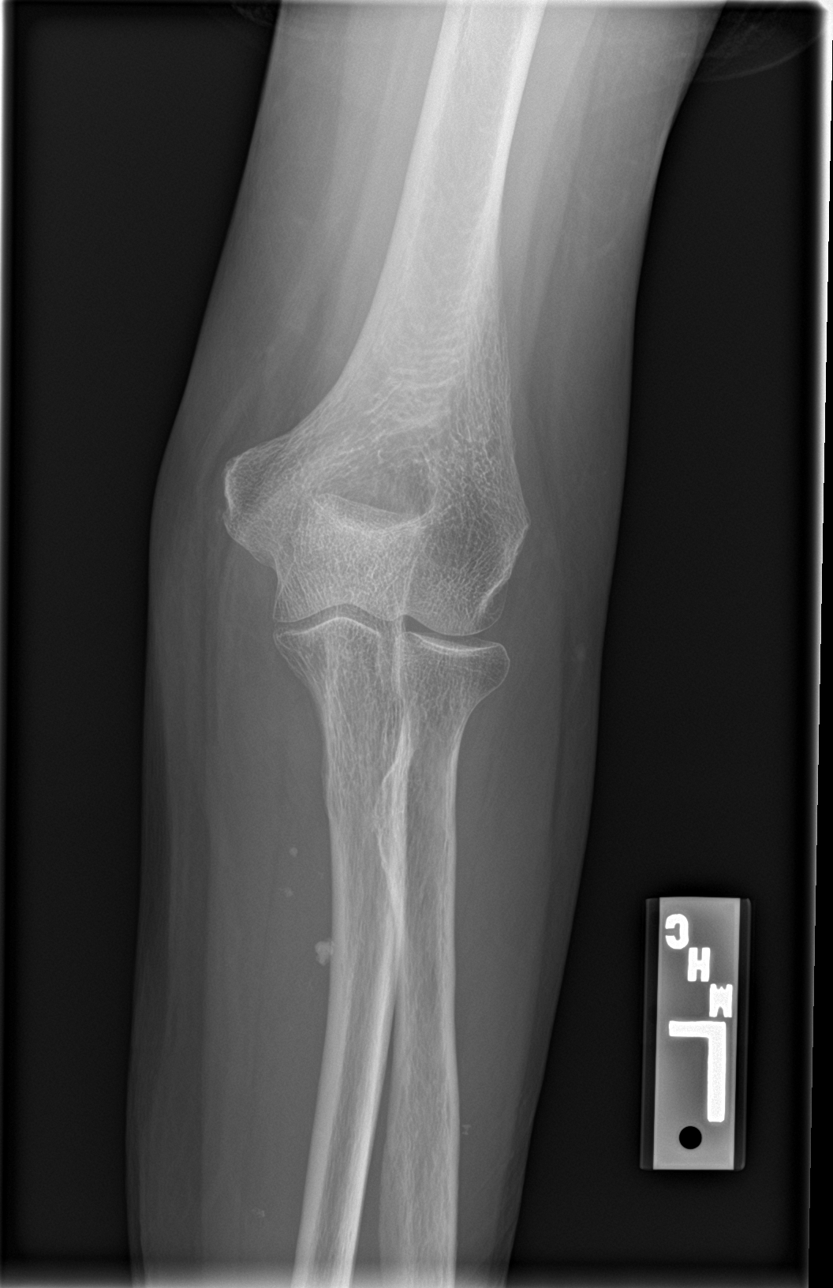

[elbow obl (1 of 2)]
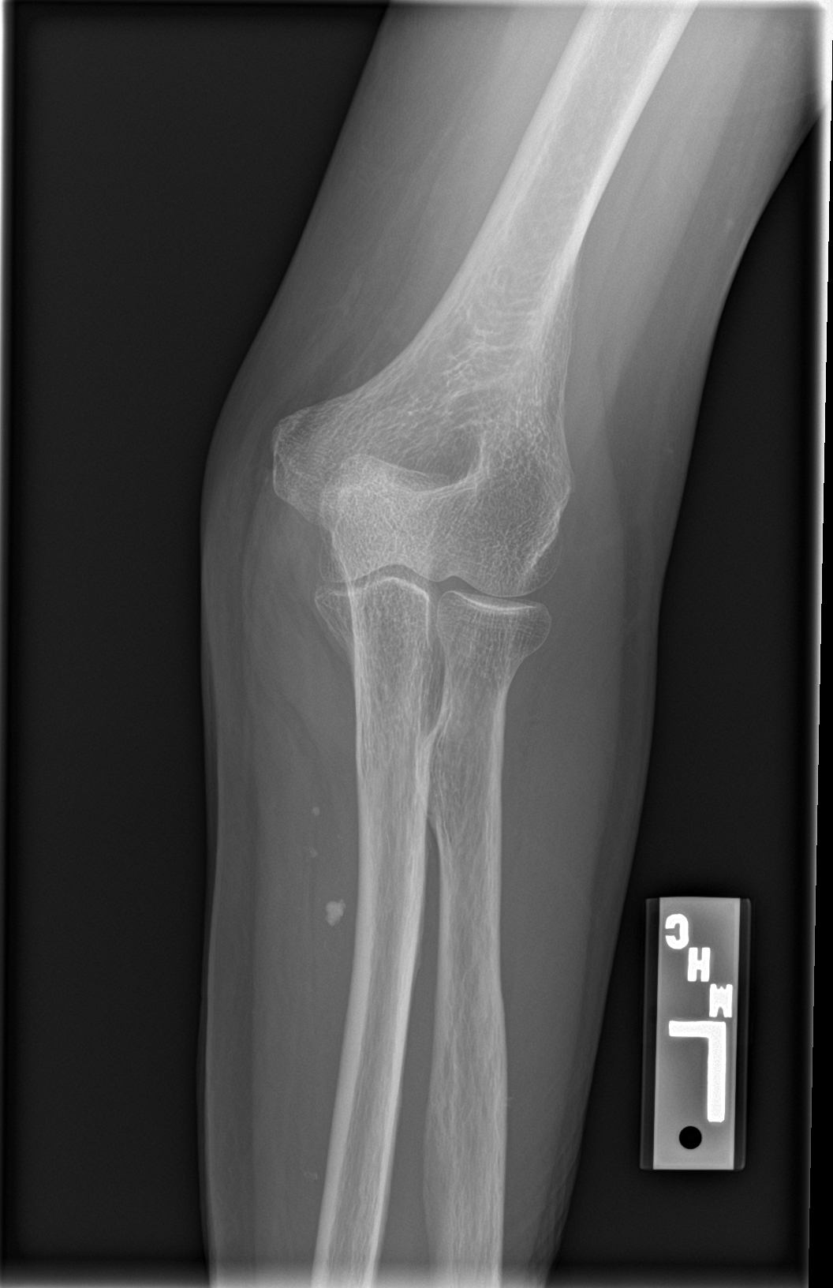

[elbow obl (2 of 2)]
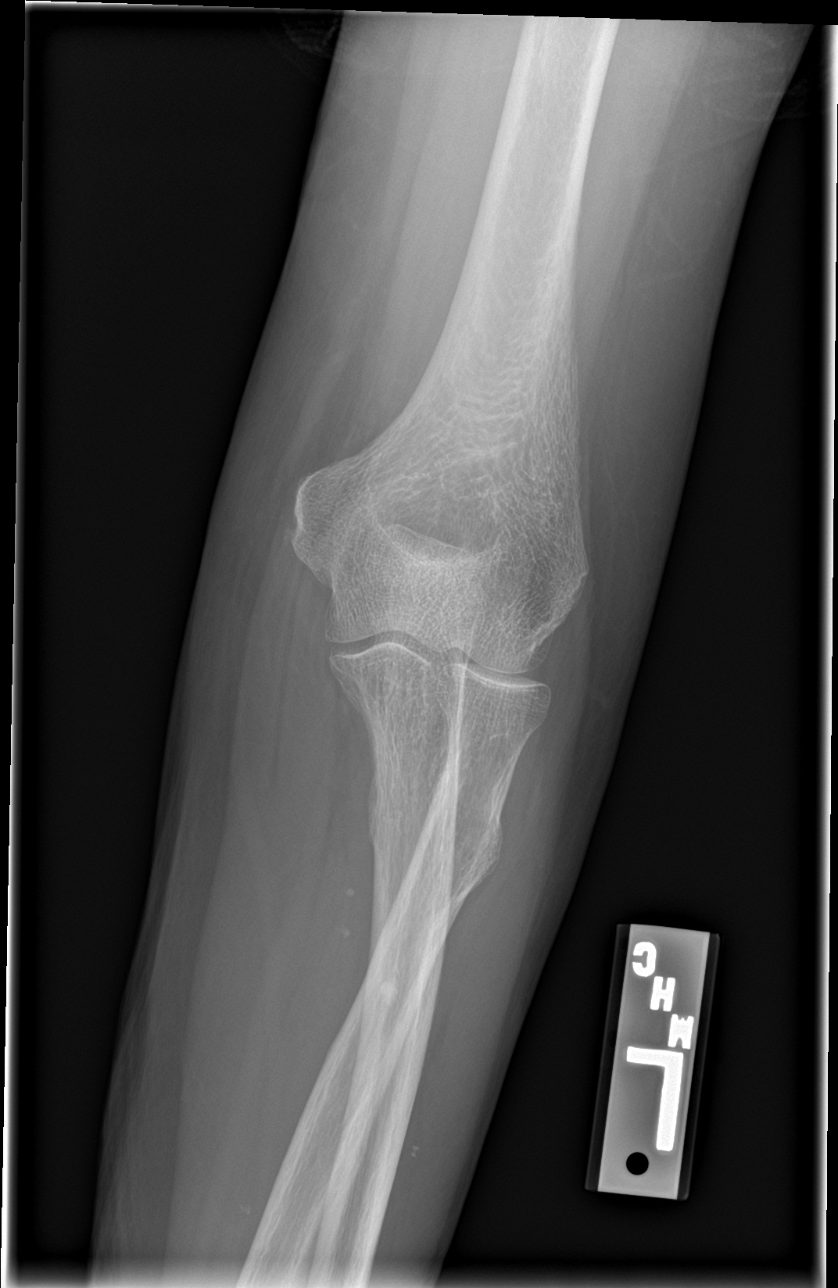

[elbow lat]
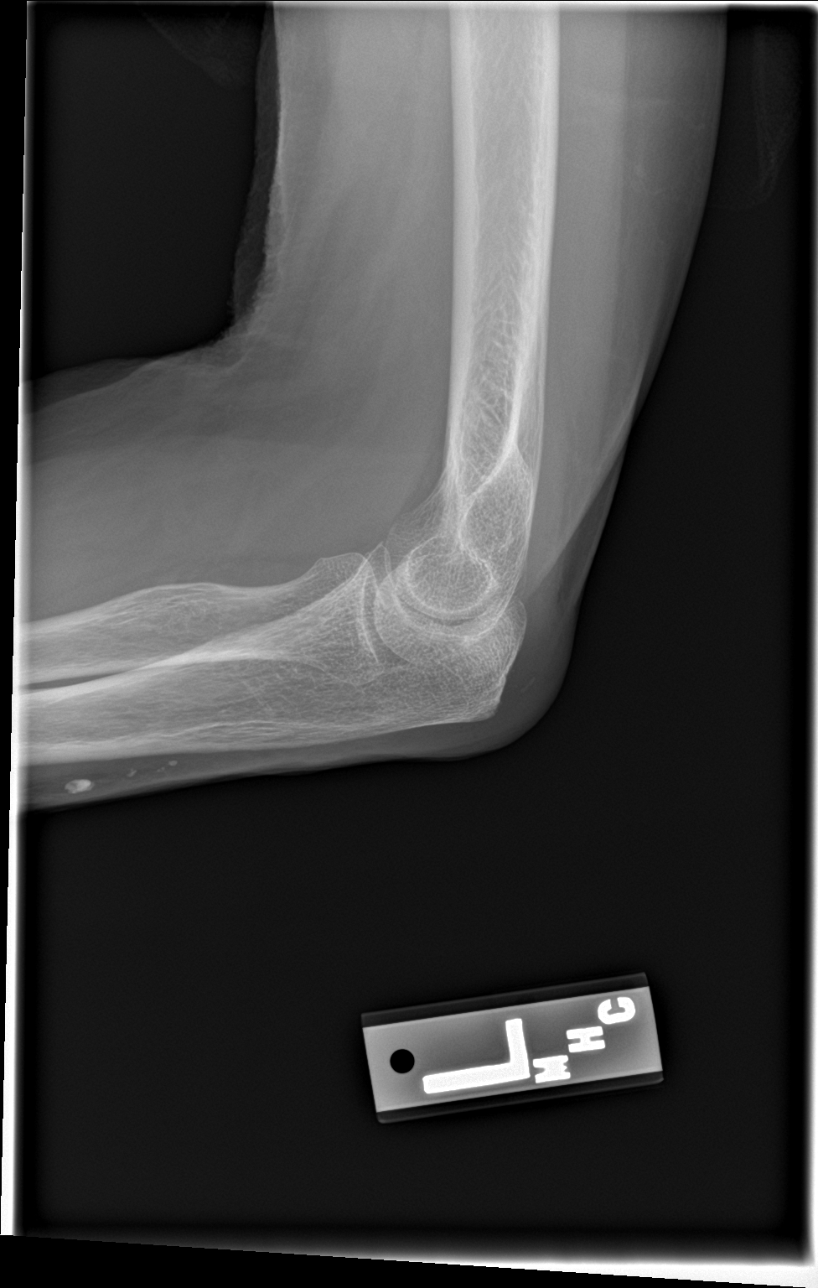

[4 of 4 positions shown; findings below may reference images not displayed]

FINDINGS: There is no evidence of fracture, dislocation, or joint effusion.
There is no evidence of arthropathy or other focal bone abnormality.
Mild soft tissue swelling is seen posterior to the olecranon.
Subcutaneous calcifications in the posterior aspect of the proximal
forearm are likely due to some remote injury.
IMPRESSION: No acute bony or joint abnormality.

Mild soft tissue swelling posterior to the olecranon may be due to
bursitis.

## 2022-03-07 ENCOUNTER — Emergency Department (HOSPITAL_BASED_OUTPATIENT_CLINIC_OR_DEPARTMENT_OTHER)
Admission: EM | Admit: 2022-03-07 | Discharge: 2022-03-08 | Disposition: A | Discharge status: Home / Self Care | Payer: Medicare PPO | Attending: Emergency Medicine | Admitting: Emergency Medicine

## 2022-03-07 ENCOUNTER — Encounter (HOSPITAL_BASED_OUTPATIENT_CLINIC_OR_DEPARTMENT_OTHER): Payer: Self-pay

## 2022-03-07 ENCOUNTER — Other Ambulatory Visit: Payer: Self-pay

## 2022-03-07 DIAGNOSIS — R34 Anuria and oliguria: Secondary | ICD-10-CM | POA: Diagnosis present

## 2022-03-07 DIAGNOSIS — I10 Essential (primary) hypertension: Secondary | ICD-10-CM | POA: Diagnosis not present

## 2022-03-07 DIAGNOSIS — F0390 Unspecified dementia without behavioral disturbance: Secondary | ICD-10-CM | POA: Diagnosis not present

## 2022-03-07 DIAGNOSIS — Z711 Person with feared health complaint in whom no diagnosis is made: Secondary | ICD-10-CM

## 2022-03-07 DIAGNOSIS — Z8659 Personal history of other mental and behavioral disorders: Secondary | ICD-10-CM | POA: Diagnosis not present

## 2022-03-07 LAB — CBC WITH DIFFERENTIAL/PLATELET
Abs Immature Granulocytes: 0.02 10*3/uL (ref 0.00–0.07)
Basophils Absolute: 0.1 10*3/uL (ref 0.0–0.1)
Basophils Relative: 1 %
Eosinophils Absolute: 0.3 10*3/uL (ref 0.0–0.5)
Eosinophils Relative: 4 %
HCT: 39.8 % (ref 36.0–46.0)
Hemoglobin: 13 g/dL (ref 12.0–15.0)
Immature Granulocytes: 0 %
Lymphocytes Relative: 26 %
Lymphs Abs: 2 10*3/uL (ref 0.7–4.0)
MCH: 31.5 pg (ref 26.0–34.0)
MCHC: 32.7 g/dL (ref 30.0–36.0)
MCV: 96.4 fL (ref 80.0–100.0)
Monocytes Absolute: 0.8 10*3/uL (ref 0.1–1.0)
Monocytes Relative: 10 %
Neutro Abs: 4.7 10*3/uL (ref 1.7–7.7)
Neutrophils Relative %: 59 %
Platelets: 259 10*3/uL (ref 150–400)
RBC: 4.13 MIL/uL (ref 3.87–5.11)
RDW: 13.1 % (ref 11.5–15.5)
WBC: 7.9 10*3/uL (ref 4.0–10.5)
nRBC: 0 % (ref 0.0–0.2)

## 2022-03-07 LAB — URINALYSIS, ROUTINE W REFLEX MICROSCOPIC
Bilirubin Urine: NEGATIVE
Glucose, UA: NEGATIVE mg/dL
Ketones, ur: NEGATIVE mg/dL
Leukocytes,Ua: NEGATIVE
Nitrite: NEGATIVE
Protein, ur: NEGATIVE mg/dL
Specific Gravity, Urine: 1.009 (ref 1.005–1.030)
pH: 6 (ref 5.0–8.0)

## 2022-03-07 LAB — BASIC METABOLIC PANEL
Anion gap: 9 (ref 5–15)
BUN: 17 mg/dL (ref 8–23)
CO2: 27 mmol/L (ref 22–32)
Calcium: 9.9 mg/dL (ref 8.9–10.3)
Chloride: 103 mmol/L (ref 98–111)
Creatinine, Ser: 0.73 mg/dL (ref 0.44–1.00)
GFR, Estimated: >60 mL/min (ref >60)
Glucose, Bld: 82 mg/dL (ref 70–99)
Potassium: 3.7 mmol/L (ref 3.5–5.1)
Sodium: 139 mmol/L (ref 135–145)

## 2022-03-07 MED ORDER — SODIUM CHLORIDE 0.9 % IV BOLUS
1000.0000 mL | Freq: Once | INTRAVENOUS | Status: AC
  Administered 2022-03-07: 1000 mL via INTRAVENOUS

## 2022-03-07 NOTE — ED Triage Notes (Signed)
Patient here POV from [place] at [facility] with Family.  Endorses Decreased PO Intake and Decreased Urine Output Today.  Facility Staff concerned for UTI. No Pain. No N/V/D.   NAD Noted during Triage. A&Ox4. GCS 15. Ambulatory.

## 2022-03-07 NOTE — ED Notes (Signed)
In and out cath #34fr using sterile technique to obtain specimen. Immediate clear pale yellow urine noted.  Pt tolerated well.

## 2022-03-07 NOTE — Discharge Instructions (Addendum)
We evaluated your mother today in the emergency department for a urinary infection.  We sent her urine to the lab, and it did not have any signs of a urine infection.  When we evaluated your mother, she denied any complaints, and you felt that she was at her baseline status.  Since she does not have any symptoms and her tests are reassuring, I feel it is safe for her to return to her facility.  Please return to the emergency department if she experiences any new or worsening symptoms such as headache, pain in the chest, pain in the abdomen, vomiting, or any other new or concerning symptoms.

## 2022-03-08 NOTE — ED Provider Notes (Signed)
[facility] EMERGENCY DEPT Provider Note  CSN: [id number] Arrival date & time: 03/07/22 1652  Chief Complaint(s) Urinary Tract Infection  HPI Mary Keller is a 88 y.o. female with history of dementia, hypertension, hyperlipidemia presenting to the emergency department with reported decreased p.o. intake and urine output.  Per daughter, patient appears to be at baseline.  Patient denies any focal complaints such as headaches, chest pain, belly pain, painful urination, nausea, vomiting, or any other symptoms.  History significantly limited due to dementia.   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 Authorizing Provider  atorvastatin (LIPITOR) 40 MG tablet Take 40 mg by mouth. 08/28/10   [provider]  Brimonidine Tartrate-Timolol (COMBIGAN OP) Apply to eye. Both eyes    [provider]  brinzolamide (AZOPT) 1 % ophthalmic suspension 1 drop 3 (three) times daily. Right eye only    [provider]  diphenhydrAMINE (BENADRYL) 25 MG tablet Take 1 tablet (25 mg total) by mouth every 6 (six) hours as needed for up to 2 doses. 04/22/20   Merrilee Jansky, MD  escitalopram (LEXAPRO) 20 MG tablet Take by mouth. 10/15/16   [provider]  famotidine (PEPCID) 20 MG tablet Take 1 tablet (20 mg total) by mouth 2 (two) times daily for 3 days. 04/22/20 04/25/20  Merrilee Jansky, MD  ibuprofen (ADVIL,MOTRIN) 200 MG tablet Take by mouth. 08/28/10   [provider]  latanoprost (XALATAN) 0.005 % ophthalmic solution 1  drop at bedtime. Both eyes    [provider]  levofloxacin (LEVAQUIN) 500 MG tablet Take 1 tablet (500 mg total) by mouth daily. 01/08/21   Terrilee Files, MD  memantine (NAMENDA) 5 MG tablet Take 5 mg by mouth 2 (two) times daily.    [provider]  pilocarpine (PILOCAR) 4 % ophthalmic solution 1 drop 4 (four) times daily. Right eye only    [provider]  ramipril (ALTACE) 5 MG capsule Take 5 mg by mouth once. 08/28/10   [provider]                                                                                                                                    Past Surgical History Past Surgical History:  Procedure Laterality Date   ANKLE FRACTURE SURGERY     Family History Family History  Family history unknown: Yes    Social History Social History   Tobacco Use   Smoking status: Never  Vaping Use  Vaping Use: Never used  Substance Use Topics   Alcohol use: Not Currently   Drug use: Never   Allergies Penicillins and Sulfamethoxazole  Review of Systems Review of Systems  All other systems reviewed and are negative.   Physical Exam Vital Signs  I have reviewed the triage vital signs BP (!) 167/81 (BP Location: Right Arm)   Pulse (!) 58   Temp 97.7 F (36.5 C) (Oral)   Resp 18   Ht 5\' 3"  (1.6 m)   Wt 56.5 kg   SpO2 98%   BMI 22.06 kg/m  Physical Exam Vitals and nursing note reviewed.  Constitutional:      General: She is not in acute distress.    Appearance: She is well-developed.  HENT:     Head: Normocephalic and atraumatic.     Mouth/Throat:     Mouth: Mucous membranes are moist.  Eyes:     Pupils: Pupils are equal, round, and reactive to light.  Cardiovascular:     Rate and Rhythm: Normal rate and regular rhythm.     Heart sounds: No murmur heard. Pulmonary:     Effort: Pulmonary effort is normal. No respiratory distress.     Breath sounds: Normal breath sounds.  Abdominal:     General: Abdomen is  flat.     Palpations: Abdomen is soft.     Tenderness: There is no abdominal tenderness.  Musculoskeletal:        General: No tenderness.     Right lower leg: No edema.     Left lower leg: No edema.  Skin:    General: Skin is warm and dry.  Neurological:     General: No focal deficit present.     Mental Status: She is alert. Mental status is at baseline.     Comments: Follows commands, oriented x0, oriented to name with prompting, fluent speech, no facial droop, per daughter patient at baseline  Psychiatric:        Mood and Affect: Mood normal.        Behavior: Behavior normal.     ED Results and Treatments Labs (all labs ordered are listed, but only abnormal results are displayed) Labs Reviewed  URINALYSIS, ROUTINE W REFLEX MICROSCOPIC - Abnormal; Notable for the following components:      Result Value   Color, Urine COLORLESS (*)    Hgb urine dipstick SMALL (*)    All other components within normal limits  BASIC METABOLIC PANEL  CBC WITH DIFFERENTIAL/PLATELET                                                                                                                          Radiology No results found.  Pertinent labs & imaging results that were available during my care of the patient were reviewed by me and considered in my medical decision making (see MDM for details).  Medications Ordered in ED Medications  sodium chloride 0.9 % bolus 1,000 mL (0 mLs Intravenous  Stopped 03/07/22 2217)                                                                                                                                     Procedures Procedures  (including critical care time)  Medical Decision Making / ED Course   MDM:  88 year old female presenting to the emergency department with decreased p.o. intake.  Patient overall well-appearing, denies any complaints.  History limited by dementia but review of systems negative..  Unclear cause of patient's symptoms but  per daughter, patient appears to be at baseline currently.  No history of head injury to suggest intracranial bleeding and patient denies headache currently.  Her neurologic exam is at baseline.  No indication for head CT.  No focal lung findings to suggest pneumonia.  No rashes.  Doubt other occult infectious source.  Given reassuring work-up, will discharge back to facility with strict return precautions.  Daughter is agreeable to plan.      Additional history obtained: -Additional history obtained from daughter -External records from outside source obtained and reviewed including: Chart review including previous notes, labs, imaging, consultation notes   Lab Tests: -I ordered, reviewed, and interpreted labs.   The pertinent results include:   Labs Reviewed  URINALYSIS, ROUTINE W REFLEX MICROSCOPIC - Abnormal; Notable for the following components:      Result Value   Color, Urine COLORLESS (*)    Hgb urine dipstick SMALL (*)    All other components within normal limits  BASIC METABOLIC PANEL  CBC WITH DIFFERENTIAL/PLATELET      EKG   EKG Interpretation  Date/Time:    Ventricular Rate:    PR Interval:    QRS Duration:   QT Interval:    QTC Calculation:   R Axis:     Text Interpretation:             Medicines ordered and prescription drug management: Meds ordered this encounter  Medications   sodium chloride 0.9 % bolus 1,000 mL    -I have reviewed the patients home medicines and have made adjustments as needed    Cardiac Monitoring: The patient was maintained on a cardiac monitor.  I personally viewed and interpreted the cardiac monitored which showed an underlying rhythm of: NSR  Social Determinants of Health:  Factors impacting patients care include: dementia   Reevaluation: After the interventions noted above, I reevaluated the patient and found that they have :stayed the same  Co morbidities that complicate the patient evaluation  Past Medical  History:  Diagnosis Date   Dementia (HCC)    Depression    Glaucoma    Hypercholesteremia    Hypertension       Dispostion: Discharge     Final Clinical Impression(s) / ED Diagnoses Final diagnoses:  Feared complaint without diagnosis  Hx of confusion without neurologic abnormalities     This chart was dictated using voice recognition software.  Despite best efforts to proofread,  errors can occur which can change the documentation meaning.    Lonell Grandchild, MD 03/08/22 [phone]

## 2022-08-14 ENCOUNTER — Emergency Department (HOSPITAL_COMMUNITY): Payer: Medicare PPO

## 2022-08-14 ENCOUNTER — Emergency Department (HOSPITAL_COMMUNITY)
Admission: EM | Admit: 2022-08-14 | Discharge: 2022-08-14 | Disposition: A | Discharge status: Skilled Nursing Facility | Payer: Medicare PPO | Attending: Emergency Medicine | Admitting: Emergency Medicine

## 2022-08-14 ENCOUNTER — Other Ambulatory Visit: Payer: Self-pay

## 2022-08-14 ENCOUNTER — Encounter (HOSPITAL_COMMUNITY): Payer: Self-pay | Admitting: Emergency Medicine

## 2022-08-14 DIAGNOSIS — R42 Dizziness and giddiness: Secondary | ICD-10-CM | POA: Insufficient documentation

## 2022-08-14 DIAGNOSIS — R001 Bradycardia, unspecified: Secondary | ICD-10-CM | POA: Insufficient documentation

## 2022-08-14 DIAGNOSIS — F0390 Unspecified dementia without behavioral disturbance: Secondary | ICD-10-CM | POA: Diagnosis not present

## 2022-08-14 DIAGNOSIS — R55 Syncope and collapse: Secondary | ICD-10-CM | POA: Diagnosis present

## 2022-08-14 LAB — COMPREHENSIVE METABOLIC PANEL
ALT: 20 U/L (ref 0–44)
AST: 25 U/L (ref 15–41)
Albumin: 3 g/dL — ABNORMAL LOW (ref 3.5–5.0)
Alkaline Phosphatase: 66 U/L (ref 38–126)
Anion gap: 8 (ref 5–15)
BUN: 17 mg/dL (ref 8–23)
CO2: 27 mmol/L (ref 22–32)
Calcium: 8.9 mg/dL (ref 8.9–10.3)
Chloride: 103 mmol/L (ref 98–111)
Creatinine, Ser: 0.9 mg/dL (ref 0.44–1.00)
GFR, Estimated: >60 mL/min (ref >60)
Glucose, Bld: 79 mg/dL (ref 70–99)
Potassium: 3.7 mmol/L (ref 3.5–5.1)
Sodium: 138 mmol/L (ref 135–145)
Total Bilirubin: 0.6 mg/dL (ref 0.3–1.2)
Total Protein: 5.6 g/dL — ABNORMAL LOW (ref 6.5–8.1)

## 2022-08-14 LAB — TROPONIN I (HIGH SENSITIVITY): Troponin I (High Sensitivity): 4 ng/L (ref <18)

## 2022-08-14 LAB — CBC WITH DIFFERENTIAL/PLATELET
Abs Immature Granulocytes: 0.03 10*3/uL (ref 0.00–0.07)
Basophils Absolute: 0.1 10*3/uL (ref 0.0–0.1)
Basophils Relative: 1 %
Eosinophils Absolute: 0.5 10*3/uL (ref 0.0–0.5)
Eosinophils Relative: 5 %
HCT: 38.6 % (ref 36.0–46.0)
Hemoglobin: 12 g/dL (ref 12.0–15.0)
Immature Granulocytes: 0 %
Lymphocytes Relative: 25 %
Lymphs Abs: 2.4 10*3/uL (ref 0.7–4.0)
MCH: 31.4 pg (ref 26.0–34.0)
MCHC: 31.1 g/dL (ref 30.0–36.0)
MCV: 101 fL — ABNORMAL HIGH (ref 80.0–100.0)
Monocytes Absolute: 0.9 10*3/uL (ref 0.1–1.0)
Monocytes Relative: 10 %
Neutro Abs: 5.6 10*3/uL (ref 1.7–7.7)
Neutrophils Relative %: 59 %
Platelets: 226 10*3/uL (ref 150–400)
RBC: 3.82 MIL/uL — ABNORMAL LOW (ref 3.87–5.11)
RDW: 13 % (ref 11.5–15.5)
WBC: 9.4 10*3/uL (ref 4.0–10.5)
nRBC: 0 % (ref 0.0–0.2)

## 2022-08-14 MED ORDER — SODIUM CHLORIDE 0.9 % IV BOLUS
500.0000 mL | Freq: Once | INTRAVENOUS | Status: AC
  Administered 2022-08-14: 500 mL via INTRAVENOUS

## 2022-08-14 MED ORDER — SODIUM CHLORIDE 0.9 % IV BOLUS: 1000.0000 mL | Freq: Once | INTRAVENOUS | Status: DC

## 2022-08-14 NOTE — ED Triage Notes (Signed)
Pt BIB [facility] from [place] at [facility]. Pt had a near syncope episode and care taker sat her down. EMS reported her initial vital at the facility was 87/53. Ems took her blood pressure manually it was 84/40, she was given Normal saline 1 liter bolus. Patient had bradycardia 48. Patient have dementia and she did return to her baseline.

## 2022-08-14 NOTE — ED Provider Notes (Signed)
[facility] Provider Note   CSN: [id number] Arrival date & time: 08/14/22  1201     History  Chief Complaint  Patient presents with   Near Syncope    Mary Keller is a 89 y.o. female.  Patient here after near syncopal event at her nursing home.  She had just gotten up from her chair got lightheaded looking.  Was placed back on a chair.  Blood pressure was in the 06C systolic, heart rate in the 40s.  She had a fluid bolus with EMS.  She did not lose consciousness completely.  She has no chest pain or shortness of breath.  She has no symptoms currently.  She does have a history of dementia, high cholesterol, glaucoma.  Nothing makes it worse or better.  Denies any recent illness.  Denies any fever, chills, pain with urination.  No weakness numbness or vision loss.  The history is provided by the patient and a caregiver.       Home Medications Prior to Admission medications   Medication Sig Start Date End Date Taking? Authorizing Provider  acetaminophen (TYLENOL) 500 MG tablet Take 500 mg by mouth 2 (two) times daily.   Yes [provider]  atorvastatin (LIPITOR) 40 MG tablet Take 40 mg by mouth every evening.   Yes [provider]  brimonidine-timolol (COMBIGAN) 0.2-0.5 % ophthalmic solution Place 1 drop into both eyes every 12 (twelve) hours.   Yes [provider]  brinzolamide (AZOPT) 1 % ophthalmic suspension Place 1 drop into the right eye 3 (three) times daily.   Yes [provider]  Cholecalciferol (VITAMIN D3 SUPER STRENGTH) 50 MCG (2000 UT) TABS Take 2,000 Units by mouth daily.   Yes [provider]  escitalopram (LEXAPRO) 20 MG tablet Take 20 mg by mouth daily.   Yes [provider]  latanoprost (XALATAN) 0.005 % ophthalmic solution Place 1 drop into both eyes at bedtime.   Yes [provider]  melatonin 3 MG TABS tablet Take 3 mg by mouth at bedtime.   Yes  [provider]  memantine (NAMENDA) 10 MG tablet Take 10 mg by mouth 2 (two) times daily.   Yes [provider]  pilocarpine (PILOCAR) 1 % ophthalmic solution Place 1 drop into the right eye 3 (three) times daily.   Yes [provider]  ramipril (ALTACE) 5 MG capsule Take 5 mg by mouth daily.   Yes [provider]      Allergies    Penicillins and Sulfamethoxazole    Review of Systems   Review of Systems  Physical Exam Updated Vital Signs BP (!) 146/81   Pulse 66   Temp (!) 97.5 F (36.4 C) (Axillary)   Resp 16   Ht 5\' 3"  (1.6 m)   Wt 54.4 kg   SpO2 100%   BMI 21.26 kg/m  Physical Exam Vitals and nursing note reviewed.  Constitutional:      General: She is not in acute distress.    Appearance: She is well-developed. She is not ill-appearing.  HENT:     Head: Normocephalic and atraumatic.     Nose: Nose normal.     Mouth/Throat:     Mouth: Mucous membranes are moist.  Eyes:     Extraocular Movements: Extraocular movements intact.     Conjunctiva/sclera: Conjunctivae normal.     Pupils: Pupils are equal, round, and reactive to light.  Cardiovascular:     Rate and Rhythm:  Regular rhythm. Bradycardia present.     Pulses: Normal pulses.     Heart sounds: Normal heart sounds. No murmur heard. Pulmonary:     Effort: Pulmonary effort is normal. No respiratory distress.     Breath sounds: Normal breath sounds.  Abdominal:     General: Abdomen is flat.     Palpations: Abdomen is soft.     Tenderness: There is no abdominal tenderness.  Musculoskeletal:        General: No swelling. Normal range of motion.     Cervical back: Normal range of motion and neck supple.  Skin:    General: Skin is warm and dry.     Capillary Refill: Capillary refill takes less than 2 seconds.  Neurological:     General: No focal deficit present.     Mental Status: She is alert and oriented to person, place, and time.     Cranial Nerves: No cranial nerve  deficit.     Sensory: No sensory deficit.     Motor: No weakness.     Coordination: Coordination normal.  Psychiatric:        Mood and Affect: Mood normal.     ED Results / Procedures / Treatments   Labs (all labs ordered are listed, but only abnormal results are displayed) Labs Reviewed  CBC WITH DIFFERENTIAL/PLATELET - Abnormal; Notable for the following components:      Result Value   RBC 3.82 (*)    MCV 101.0 (*)    All other components within normal limits  COMPREHENSIVE METABOLIC PANEL - Abnormal; Notable for the following components:   Total Protein 5.6 (*)    Albumin 3.0 (*)    All other components within normal limits  TROPONIN I (HIGH SENSITIVITY)    EKG EKG Interpretation  Date/Time:  Friday August 14 2022 12:44:59 EST Ventricular Rate:  41 PR Interval:  200 QRS Duration: 91 QT Interval:  504 QTC Calculation: 417 R Axis:   51 Text Interpretation: Sinus bradycardia Atrial premature complexes Low voltage, precordial leads Confirmed by Ronnald Nian, Colleen Kotlarz ([id number]) on 08/14/2022 1:03:28 PM  Radiology No results found.  Procedures Procedures    Medications Ordered in ED Medications  sodium chloride 0.9 % bolus 500 mL (0 mLs Intravenous Stopped 08/14/22 1414)    ED Course/ Medical Decision Making/ A&P                             Medical Decision Making Amount and/or Complexity of Data Reviewed Labs: ordered. Radiology: ordered.   Mary Keller is here after near syncopal event from assisted living facility.  Patient had stood up got a little bit lightheaded sat back down.  Blood pressure was 80 systolic with EMS and now has improved with 1 L of normal saline.  Orthostatics with me are normal.  Patient has 2 EKGs that shows sinus bradycardia.  I had cardiology review these EKGs and overall I do not see any evidence of heart block and neither this cardiology.  CBC and CMP were ordered and per my review and interpretation there is no significant anemia or  electrolyte abnormality or kidney injury.  CT scan per radiology report of the head shows no acute findings.  Chest x-ray per radiology report shows no evidence of pneumonia or pneumothorax.  Troponin is normal.  Overall she is neurologically intact.  She is well-appearing.  I suspect that this was a vasovagal/orthostatic event.  She is not on  any metoprolol or calcium channel blockers or other heart rate medications.  She does have a history of dementia.  Does look like she is on ramipril.  Her blood pressure now is 146/81.  Heart rate in the 60s.  Overall I recommend close follow-up with primary care doctor.  Told to return to the ED if symptoms worsen.  Recommend good nutrition and hydration at home.  Discharged in good condition.  This chart was dictated using voice recognition software.  Despite best efforts to proofread,  errors can occur which can change the documentation meaning.         Final Clinical Impression(s) / ED Diagnoses Final diagnoses:  Near syncope    Rx / DC Orders ED Discharge Orders     None         Lennice Sites, DO 08/14/22 1441

## 2022-08-14 NOTE — Discharge Instructions (Signed)
Please follow-up with your primary care doctor.

## 2024-01-15 ENCOUNTER — Emergency Department (HOSPITAL_COMMUNITY)

## 2024-01-15 ENCOUNTER — Encounter (HOSPITAL_COMMUNITY): Payer: Self-pay

## 2024-01-15 ENCOUNTER — Other Ambulatory Visit: Payer: Self-pay

## 2024-01-15 ENCOUNTER — Emergency Department (HOSPITAL_COMMUNITY)
Admission: EM | Admit: 2024-01-15 | Discharge: 2024-01-15 | Disposition: A | Discharge status: Skilled Nursing Facility | Attending: Emergency Medicine | Admitting: Emergency Medicine

## 2024-01-15 DIAGNOSIS — F0390 Unspecified dementia without behavioral disturbance: Secondary | ICD-10-CM | POA: Diagnosis not present

## 2024-01-15 DIAGNOSIS — S300XXA Contusion of lower back and pelvis, initial encounter: Secondary | ICD-10-CM | POA: Diagnosis not present

## 2024-01-15 DIAGNOSIS — M549 Dorsalgia, unspecified: Secondary | ICD-10-CM | POA: Diagnosis present

## 2024-01-15 DIAGNOSIS — W19XXXA Unspecified fall, initial encounter: Secondary | ICD-10-CM

## 2024-01-15 DIAGNOSIS — I10 Essential (primary) hypertension: Secondary | ICD-10-CM | POA: Diagnosis not present

## 2024-01-15 DIAGNOSIS — W1830XA Fall on same level, unspecified, initial encounter: Secondary | ICD-10-CM | POA: Insufficient documentation

## 2024-01-15 NOTE — ED Provider Notes (Signed)
 [facility]-EMERGENCY DEPT [facility] Emergency Department Provider Note MRN:  [id number]  Arrival date & time: 01/15/24     Chief Complaint   Fall   History of Present Illness   Mary Keller is a 89 y.o. year-old female with a history of dementia presenting to the ED with chief complaint of fall.  Patient found on the floor next to her bed.  Happened at a care facility.  Suspected that patient fell out of bed and then could not get back up.  Patient is pleasantly demented and unable to answer questions.  Review of Systems  I was unable to obtain a full/accurate HPI, PMH, or ROS due to the patient's dementia.  Patient's Health History    Past Medical History:  Diagnosis Date   Dementia (HCC)    Depression    Glaucoma    Hypercholesteremia    Hypertension     Past Surgical History:  Procedure Laterality Date   ANKLE FRACTURE SURGERY      Family History  Family history unknown: Yes    Social History   Socioeconomic History   Marital status: Widowed    Spouse name: Not on file   Number of children: Not on file   Years of education: Not on file   Highest education level: Not on file  Occupational History   Not on file  Tobacco Use   Smoking status: Never   Smokeless tobacco: Not on file  Vaping Use   Vaping status: Never Used  Substance and Sexual Activity   Alcohol use: Not Currently   Drug use: Never   Sexual activity: Not on file  Other Topics Concern   Not on file  Social History Narrative   Not on file   Social Drivers of Health   Financial Resource Strain: Not on file  Food Insecurity: Not on file  Transportation Needs: Not on file  Physical Activity: Not on file  Stress: Not on file  Social Connections: Not on file  Intimate Partner Violence: Not on file     Physical Exam   Vitals:   01/15/24 0250 01/15/24 0251  BP: (!) 159/84 (!) 159/84  Pulse: 77 70  Resp: 20 (!) 22  Temp:  (!) 97.5 F (36.4 C)  SpO2: 98% 96%    CONSTITUTIONAL:  Well-appearing, NAD NEURO/PSYCH: Awake and alert, not oriented to name place or time EYES:  eyes equal and reactive ENT/NECK:  no LAD, no JVD CARDIO: Regular rate, well-perfused, normal S1 and S2 PULM:  CTAB no wheezing or rhonchi GI/GU:  non-distended, non-tender MSK/SPINE:  No gross deformities, no edema SKIN:  no rash, atraumatic   *Additional and/or pertinent findings included in MDM below  Diagnostic and Interventional Summary    EKG Interpretation Date/Time:    Ventricular Rate:    PR Interval:    QRS Duration:    QT Interval:    QTC Calculation:   R Axis:      Text Interpretation:         Labs Reviewed - No data to display  DG Lumbar Spine Complete  Final Result      Medications - No data to display   Procedures  /  Critical Care Procedures  ED Course and Medical Decision Making  Initial Impression and Ddx Well-appearing in no acute distress with normal vital signs.  Lungs clear and present in all fields.  Abdomen soft and nontender.  Normal range of motion of the extremities.  Normal range of motion of  the neck, no signs of head trauma.  There is a bruise to the midline lumbar area, not terribly tender.  X-ray to exclude fracture.  Otherwise no indication for other diagnostics.  Past medical/surgical history that increases complexity of ED encounter: Dementia  Interpretation of Diagnostics I personally reviewed the lumbar x-ray and my interpretation is as follows: No fracture    Patient Reassessment and Ultimate Disposition/Management     X-ray negative, continues to be pleasant and comfortable appearing.  Appropriate for discharge.  Patient management required discussion with the following services or consulting groups:  None  Complexity of Problems Addressed Acute illness or injury that poses threat of life of bodily function  Additional Data Reviewed and Analyzed Further history obtained from: Further history from spouse/family  member  Additional Factors Impacting ED Encounter Risk None  Ozell HERO. Theadore, MD [facility] Emergency Medicine [facility] [email or web address]  Final Clinical Impressions(s) / ED Diagnoses     ICD-10-CM   1. Fall, initial encounter  [id number]       ED Discharge Orders     None        Discharge Instructions Discussed with and Provided to Patient:     Discharge Instructions      You were evaluated in the Emergency Department and after careful evaluation, we did not find any emergent condition requiring admission or further testing in the hospital.  Your exam/testing today was overall reassuring.  Please return to the Emergency Department if you experience any worsening of your condition.  Thank you for allowing us  to be a part of your care.        Theadore Ozell HERO, MD 01/15/24 [phone]

## 2024-01-15 NOTE — ED Triage Notes (Signed)
 Found on floor, last seen 1 hour before in bed sleeping. Hx dementia Aox0 164/101 HR 80 RR 16 O2 99% RA CBG 120

## 2024-01-15 NOTE — Discharge Instructions (Signed)
You were evaluated in the Emergency Department and after careful evaluation, we did not find any emergent condition requiring admission or further testing in the hospital.  Your exam/testing today was overall reassuring.  Please return to the Emergency Department if you experience any worsening of your condition.  Thank you for allowing us to be a part of your care.  

## 2024-01-20 ENCOUNTER — Encounter (HOSPITAL_COMMUNITY): Payer: Self-pay

## 2024-01-20 ENCOUNTER — Emergency Department (HOSPITAL_COMMUNITY)

## 2024-01-20 ENCOUNTER — Emergency Department (HOSPITAL_COMMUNITY)
Admission: EM | Admit: 2024-01-20 | Discharge: 2024-01-20 | Disposition: A | Discharge status: Skilled Nursing Facility | Source: Skilled Nursing Facility | Attending: Emergency Medicine | Admitting: Emergency Medicine

## 2024-01-20 ENCOUNTER — Other Ambulatory Visit: Payer: Self-pay

## 2024-01-20 DIAGNOSIS — N390 Urinary tract infection, site not specified: Secondary | ICD-10-CM | POA: Diagnosis not present

## 2024-01-20 DIAGNOSIS — D259 Leiomyoma of uterus, unspecified: Secondary | ICD-10-CM | POA: Insufficient documentation

## 2024-01-20 DIAGNOSIS — Z79899 Other long term (current) drug therapy: Secondary | ICD-10-CM | POA: Diagnosis not present

## 2024-01-20 DIAGNOSIS — G309 Alzheimer's disease, unspecified: Secondary | ICD-10-CM | POA: Insufficient documentation

## 2024-01-20 DIAGNOSIS — W19XXXA Unspecified fall, initial encounter: Secondary | ICD-10-CM | POA: Diagnosis not present

## 2024-01-20 DIAGNOSIS — K8020 Calculus of gallbladder without cholecystitis without obstruction: Secondary | ICD-10-CM | POA: Diagnosis not present

## 2024-01-20 DIAGNOSIS — R531 Weakness: Secondary | ICD-10-CM | POA: Diagnosis not present

## 2024-01-20 DIAGNOSIS — M47812 Spondylosis without myelopathy or radiculopathy, cervical region: Secondary | ICD-10-CM | POA: Insufficient documentation

## 2024-01-20 DIAGNOSIS — K5641 Fecal impaction: Secondary | ICD-10-CM

## 2024-01-20 DIAGNOSIS — R3981 Functional urinary incontinence: Secondary | ICD-10-CM | POA: Diagnosis present

## 2024-01-20 LAB — CBC WITH DIFFERENTIAL/PLATELET
Abs Immature Granulocytes: 0.13 K/uL — ABNORMAL HIGH (ref 0.00–0.07)
Basophils Absolute: 0.1 K/uL (ref 0.0–0.1)
Basophils Relative: 1 %
Eosinophils Absolute: 0.1 K/uL (ref 0.0–0.5)
Eosinophils Relative: 1 %
HCT: 47.1 % — ABNORMAL HIGH (ref 36.0–46.0)
Hemoglobin: 15.3 g/dL — ABNORMAL HIGH (ref 12.0–15.0)
Immature Granulocytes: 1 %
Lymphocytes Relative: 12 %
Lymphs Abs: 1.6 K/uL (ref 0.7–4.0)
MCH: 31.8 pg (ref 26.0–34.0)
MCHC: 32.5 g/dL (ref 30.0–36.0)
MCV: 97.9 fL (ref 80.0–100.0)
Monocytes Absolute: 0.9 K/uL (ref 0.1–1.0)
Monocytes Relative: 6 %
Neutro Abs: 11.3 K/uL — ABNORMAL HIGH (ref 1.7–7.7)
Neutrophils Relative %: 79 %
Platelets: 366 K/uL (ref 150–400)
RBC: 4.81 MIL/uL (ref 3.87–5.11)
RDW: 12.8 % (ref 11.5–15.5)
WBC: 14.2 K/uL — ABNORMAL HIGH (ref 4.0–10.5)
nRBC: 0 % (ref 0.0–0.2)

## 2024-01-20 LAB — COMPREHENSIVE METABOLIC PANEL WITH GFR
ALT: 26 U/L (ref 0–44)
AST: 32 U/L (ref 15–41)
Albumin: 3.3 g/dL — ABNORMAL LOW (ref 3.5–5.0)
Alkaline Phosphatase: 76 U/L (ref 38–126)
Anion gap: 10 (ref 5–15)
BUN: 11 mg/dL (ref 8–23)
CO2: 30 mmol/L (ref 22–32)
Calcium: 9.8 mg/dL (ref 8.9–10.3)
Chloride: 97 mmol/L — ABNORMAL LOW (ref 98–111)
Creatinine, Ser: 0.79 mg/dL (ref 0.44–1.00)
GFR, Estimated: >60 mL/min (ref >60)
Glucose, Bld: 138 mg/dL — ABNORMAL HIGH (ref 70–99)
Potassium: 3.8 mmol/L (ref 3.5–5.1)
Sodium: 137 mmol/L (ref 135–145)
Total Bilirubin: 1.4 mg/dL — ABNORMAL HIGH (ref 0.0–1.2)
Total Protein: 6.8 g/dL (ref 6.5–8.1)

## 2024-01-20 LAB — I-STAT CHEM 8, ED
BUN: 9 mg/dL (ref 8–23)
Calcium, Ion: 1.28 mmol/L (ref 1.15–1.40)
Chloride: 96 mmol/L — ABNORMAL LOW (ref 98–111)
Creatinine, Ser: 0.8 mg/dL (ref 0.44–1.00)
Glucose, Bld: 130 mg/dL — ABNORMAL HIGH (ref 70–99)
HCT: 48 % — ABNORMAL HIGH (ref 36.0–46.0)
Hemoglobin: 16.3 g/dL — ABNORMAL HIGH (ref 12.0–15.0)
Potassium: 4 mmol/L (ref 3.5–5.1)
Sodium: 139 mmol/L (ref 135–145)
TCO2: 27 mmol/L (ref 22–32)

## 2024-01-20 LAB — URINALYSIS, ROUTINE W REFLEX MICROSCOPIC
Bilirubin Urine: NEGATIVE
Glucose, UA: NEGATIVE mg/dL
Ketones, ur: NEGATIVE mg/dL
Leukocytes,Ua: NEGATIVE
Nitrite: NEGATIVE
Protein, ur: NEGATIVE mg/dL
Specific Gravity, Urine: 1.011 (ref 1.005–1.030)
pH: 6 (ref 5.0–8.0)

## 2024-01-20 LAB — I-STAT CG4 LACTIC ACID, ED: Lactic Acid, Venous: 1.2 mmol/L (ref 0.5–1.9)

## 2024-01-20 LAB — PROTIME-INR
INR: 0.9 (ref 0.8–1.2)
Prothrombin Time: 12.5 s (ref 11.4–15.2)

## 2024-01-20 MED ORDER — CIPROFLOXACIN HCL 500 MG PO TABS
500.0000 mg | ORAL_TABLET | Freq: Once | ORAL | Status: AC
  Administered 2024-01-20: 500 mg via ORAL
  Filled 2024-01-20: qty 1

## 2024-01-20 MED ORDER — IOHEXOL 300 MG/ML  SOLN
100.0000 mL | Freq: Once | INTRAMUSCULAR | Status: AC | PRN
  Administered 2024-01-20: 100 mL via INTRAVENOUS

## 2024-01-20 MED ORDER — FLEET ENEMA RE ENEM
1.0000 | ENEMA | Freq: Once | RECTAL | Status: AC
  Administered 2024-01-20: 1 via RECTAL
  Filled 2024-01-20: qty 1

## 2024-01-20 MED ORDER — CIPROFLOXACIN HCL 500 MG PO TABS: 500.0000 mg | ORAL_TABLET | Freq: Two times a day (BID) | ORAL | 0 refills | Status: AC

## 2024-01-20 NOTE — ED Provider Notes (Addendum)
 [place] [facility] DEPARTMENT AT [facility] Provider Note   CSN: [id number] Arrival date & time: 01/20/24  1235     Patient presents with: Mary Keller is a 89 y.o. female who is brought to the ED by EMS for an unwitnessed fall.  She is a resident in a senior living facility, has a known history of Alzheimer's dementia with recent decline in her mental status.  EMS states that she was found sitting in her room on the floor, was found by staff in the same condition.  She has baseline urinary incontinence was found to have voided prior to EMS arrival.  According to EMS, their exam showed tenderness with palpation at the hips bilaterally, and they placed a c-collar for spinal precautions as this was an unwitnessed fall.  Per family at bedside, her baseline mental status is that she has limited to no speech, and that she can follow basic commands however is often slow to respond.  Also, patient is incontinent at baseline requiring a brief at all times.    Fall       Prior to Admission medications   Medication Sig Start Date End Date Taking? Authorizing Provider  ciprofloxacin  (CIPRO ) 500 MG tablet Take 1 tablet (500 mg total) by mouth every 12 (twelve) hours. 01/20/24  Yes Myriam Dorn BROCKS, PA  acetaminophen (TYLENOL) 500 MG tablet Take 500 mg by mouth 2 (two) times daily.    [provider]  atorvastatin (LIPITOR) 40 MG tablet Take 40 mg by mouth every evening.    [provider]  brimonidine-timolol (COMBIGAN) 0.2-0.5 % ophthalmic solution Place 1 drop into both eyes every 12 (twelve) hours.    [provider]  brinzolamide (AZOPT) 1 % ophthalmic suspension Place 1 drop into the right eye 3 (three) times daily.    [provider]  Cholecalciferol (VITAMIN D3 SUPER STRENGTH) 50 MCG (2000 UT) TABS Take 2,000 Units by mouth daily.    [provider]  Cranberry 200 MG CAPS Take 400 mg by mouth in the morning and at  bedtime.    [provider]  docusate sodium (COLACE) 100 MG capsule Take 100 mg by mouth daily as needed for mild constipation.    [provider]  DULoxetine (CYMBALTA) 60 MG capsule Take 60 mg by mouth daily.    [provider]  estradiol (ESTRACE) 0.1 MG/GM vaginal cream Place 1 Applicatorful vaginally at bedtime.    [provider]  guaiFENesin (MUCINEX) 600 MG 12 hr tablet Take 600 mg by mouth 2 (two) times daily.    [provider]  latanoprost (XALATAN) 0.005 % ophthalmic solution Place 1 drop into both eyes at bedtime.    [provider]  loratadine (CLARITIN) 10 MG tablet Take 10 mg by mouth daily.    [provider]  melatonin 3 MG TABS tablet Take 3 mg by mouth at bedtime.    [provider]  memantine (NAMENDA) 10 MG tablet Take 10 mg by mouth 2 (two) times daily.    [provider]  Nutritional Supplements (NUTRITIONAL DRINK PO) Take 237 mLs by mouth in the morning, at noon, and at bedtime.    [provider]  pilocarpine (PILOCAR) 1 % ophthalmic solution Place 1 drop into the right eye 3 (three) times daily.    [provider]  polyethylene glycol (MIRALAX / GLYCOLAX) 17 g packet Take 17 g by mouth daily.    [provider]  Probiotic Product (PROBIOTIC PO) Take 1 tablet by mouth daily.    [provider]  ramipril (ALTACE) 5 MG capsule Take 5 mg by mouth daily.    [provider]    Allergies: Penicillins and Sulfamethoxazole    Review of Systems  Musculoskeletal:  Positive for arthralgias.  Neurological:  Positive for weakness.       Weakness at baseline per family.    Updated Vital Signs BP (!) 139/112 (BP Location: Left Arm)   Pulse 92   Temp 98.7 F (37.1 C) (Axillary)   Resp 20   Ht 5' 3 (1.6 m)   Wt 52 kg   SpO2 99%   BMI 20.31 kg/m   Physical Exam Vitals and nursing note reviewed.  Constitutional:      General: She is awake. She  is not in acute distress.    Appearance: Normal appearance.     Interventions: Cervical collar in place.     Comments: Can follow with basic commands, aphasic at baseline  HENT:     Head: Normocephalic and atraumatic.     Mouth/Throat:     Mouth: Mucous membranes are moist.     Pharynx: Oropharynx is clear.  Eyes:     General: Lids are normal. Vision grossly intact. Gaze aligned appropriately.     Extraocular Movements: Extraocular movements intact.     Conjunctiva/sclera: Conjunctivae normal.     Pupils:     Right eye: Pupil is sluggish.     Left eye: Pupil is sluggish.     Comments: Extensive cataracts noted in the right eye, bilateral pupils are reactive though sluggish.  Cardiovascular:     Rate and Rhythm: Normal rate and regular rhythm.     Pulses: Normal pulses.          Radial pulses are 2+ on the right side and 2+ on the left side.       Dorsalis pedis pulses are 2+ on the right side and 2+ on the left side.       Posterior tibial pulses are 2+ on the right side and 2+ on the left side.     Heart sounds: Normal heart sounds, S1 normal and S2 normal. No murmur heard.    No friction rub. No gallop.  Pulmonary:     Effort: Pulmonary effort is normal.     Breath sounds: Normal breath sounds and air entry.  Abdominal:     General: Abdomen is flat. Bowel sounds are normal.     Palpations: Abdomen is soft.     Tenderness: There is abdominal tenderness in the right lower quadrant and left lower quadrant.  Musculoskeletal:        General: Normal range of motion.     Cervical back: Normal range of motion and neck supple.     Right hip: Normal. No tenderness, bony tenderness or crepitus.     Left hip: Normal. No tenderness, bony tenderness or crepitus.     Right knee: Normal.     Left knee: Normal.     Right lower leg: No edema.     Left lower leg: No edema.     Comments: No gross deformities are noted, bilateral lower extremities are of equal length and there is no  internal/external rotation noted.  Unable to evaluate strength due to patient's baseline mental status.  Bilateral lower extremities were evaluated and had full range of motion with internal/external rotation and extension and flexion without any elicited pain or crepitus.  Further evaluated the bilateral knees with full range of motion without any pain or crepitus.  No gross deformities are noted.  Skin:    General: Skin is warm and dry.     Capillary Refill: Capillary refill takes less than 2 seconds.  Neurological:     General: No focal deficit present.     Mental Status: Mental status is at baseline. She is disoriented.     GCS: GCS eye subscore is 4. GCS verbal subscore is 2. GCS motor subscore is 6.     Cranial Nerves: No facial asymmetry.     Motor: Weakness present. No tremor.     Comments: Baseline mental status is that she has aphasia, does make occasional grunt style responses that her basic responses to yes or no questions.  Unable to fully assess motor function and coordination due to baseline mental status.  Psychiatric:        Mood and Affect: Mood normal.     (all labs ordered are listed, but only abnormal results are displayed) Labs Reviewed  COMPREHENSIVE METABOLIC PANEL WITH GFR - Abnormal; Notable for the following components:      Result Value   Chloride 97 (*)    Glucose, Bld 138 (*)    Albumin 3.3 (*)    Total Bilirubin 1.4 (*)    All other components within normal limits  URINALYSIS, ROUTINE W REFLEX MICROSCOPIC - Abnormal; Notable for the following components:   APPearance HAZY (*)    Hgb urine dipstick SMALL (*)    Bacteria, UA RARE (*)    All other components within normal limits  CBC WITH DIFFERENTIAL/PLATELET - Abnormal; Notable for the following components:   WBC 14.2 (*)    Hemoglobin 15.3 (*)    HCT 47.1 (*)    Neutro Abs 11.3 (*)    Abs Immature Granulocytes 0.13 (*)    All other components within normal limits  I-STAT CHEM 8, ED - Abnormal;  Notable for the following components:   Chloride 96 (*)    Glucose, Bld 130 (*)    Hemoglobin 16.3 (*)    HCT 48.0 (*)    All other components within normal limits  CULTURE, BLOOD (ROUTINE X 2)  CULTURE, BLOOD (ROUTINE X 2)  PROTIME-INR  CBC  I-STAT CG4 LACTIC ACID, ED    EKG: EKG Interpretation Date/Time:  Thursday January 20 2024 13:32:24 EDT Ventricular Rate:  78 PR Interval:  174 QRS Duration:  82 QT Interval:  396 QTC Calculation: 451 R Axis:   36  Text Interpretation: Sinus rhythm with Premature atrial complexes Low voltage QRS Nonspecific ST and T wave abnormality Abnormal ECG When compared with ECG of 14-Aug-2022 12:44, ST changes more prominent Confirmed by Towana Sharper [phone]) on 01/20/2024 1:36:52 PM  Radiology: CT HEAD WO CONTRAST Result Date: 01/20/2024 CLINICAL DATA:  Head trauma, moderate-severe, dementia EXAM: CT HEAD WITHOUT CONTRAST TECHNIQUE: Contiguous axial images were obtained from the base of the skull through the vertex without intravenous contrast. RADIATION DOSE REDUCTION: This exam was performed according to the departmental dose-optimization program which includes automated exposure control, adjustment of the mA and/or kV according to patient size and/or use of iterative reconstruction technique. COMPARISON:  August 14, 2022 FINDINGS: Brain: Proportional prominence of the ventricles and sulci, consistent with diffuse cerebral parenchymal volume loss. The ventricles otherwise maintained midline position without midline shift. Gray-white differentiation is preserved.Periventricular and subcortical white matter hypoattenuation, most consistent with changes of moderate chronic ischemic microvascular disease.No evidence of acute  territorial infarction, extra-axial fluid collection, hemorrhage, or mass lesion. The basilar cisterns are patent without downward herniation. The cerebellar hemispheres and vermis are well formed without mass lesion or focal attenuation  abnormality. No cerebellar tonsillar ectopia greater than 5 mm. Vascular: No hyperdense vessel. Calcified atherosclerotic plaque within the cavernous/supraclinoid ICA and intradural vertebral arteries. Skull: Normal. Negative for fracture or focal lesion. Sinuses/Orbits: The paranasal sinuses and mastoids are clear.The globes appear intact. No retrobulbar hematoma. Other: None. IMPRESSION: 1. No acute intracranial abnormality, specifically, no acute hemorrhage, territorial infarction, or intracranial mass. 2. Global cerebral volume loss with sequelae of advanced chronic ischemic microvascular disease. Electronically Signed   By: Rogelia Myers M.D.   On: 01/20/2024 15:21   CT CERVICAL SPINE WO CONTRAST Result Date: 01/20/2024 CLINICAL DATA:  Poly trauma, blunt. Unwitnessed fall. History of dementia. EXAM: CT CERVICAL SPINE WITHOUT CONTRAST TECHNIQUE: Multidetector CT imaging of the cervical spine was performed without intravenous contrast. Multiplanar CT image reconstructions were also generated. RADIATION DOSE REDUCTION: This exam was performed according to the departmental dose-optimization program which includes automated exposure control, adjustment of the mA and/or kV according to patient size and/or use of iterative reconstruction technique. COMPARISON:  CT cervical spine 05/01/2004. FINDINGS: Alignment: Reversal of the usual cervical lordosis without focal angulation or significant listhesis. Skull base and vertebrae: No evidence of acute cervical spine fracture or traumatic subluxation. Chronic progressive erosive changes anteriorly at C1-2 with partially calcified pannus around the odontoid process. Multilevel spondylosis with endplate osteophytes, uncinate spurring and facet hypertrophy. The left C5-6 facet joint appears ankylosed. Soft tissues and spinal canal: No prevertebral fluid or swelling. No visible canal hematoma. Disc levels: Multilevel spondylosis, mildly progressive compared with remote  examination. No evidence of large disc herniation. Mild foraminal narrowing at multiple levels due to uncinate spurring and facet hypertrophy. Upper chest: Mild scarring at the lung apices. Other: Bilateral carotid atherosclerosis. IMPRESSION: 1. No evidence of acute cervical spine fracture, traumatic subluxation or static signs of instability. 2. Multilevel cervical spondylosis, mildly progressive compared with remote examination. Electronically Signed   By: Elsie Perone M.D.   On: 01/20/2024 14:59   CT ABDOMEN PELVIS W CONTRAST Result Date: 01/20/2024 CLINICAL DATA:  Unwitnessed fall. EXAM: CT ABDOMEN AND PELVIS WITH CONTRAST TECHNIQUE: Multidetector CT imaging of the abdomen and pelvis was performed using the standard protocol following bolus administration of intravenous contrast. RADIATION DOSE REDUCTION: This exam was performed according to the departmental dose-optimization program which includes automated exposure control, adjustment of the mA and/or kV according to patient size and/or use of iterative reconstruction technique. CONTRAST:  100mL OMNIPAQUE  IOHEXOL  300 MG/ML  SOLN COMPARISON:  None Available. FINDINGS: Lower chest: No acute abnormality. Hepatobiliary: Minimal cholelithiasis. No biliary dilatation. Liver is unremarkable. Pancreas: Unremarkable. No pancreatic ductal dilatation or surrounding inflammatory changes. Spleen: Calcified splenic granulomas are noted. Adrenals/Urinary Tract: Adrenal glands are unremarkable. Kidneys are normal, without renal calculi, focal lesion, or hydronephrosis. Bladder is unremarkable. Stomach/Bowel: Stomach is within normal limits. Appendix appears normal. No evidence of bowel wall thickening, distention, or inflammatory changes. Moderate amount of stool seen in the rectum. Vascular/Lymphatic: No significant vascular findings are present. No enlarged abdominal or pelvic lymph nodes. Reproductive: Small calcified uterine fibroid is noted. No adnexal abnormality  is noted. Other: No ascites or hernia is noted. Musculoskeletal: No acute or significant osseous findings. IMPRESSION: Minimal cholelithiasis. Moderate amount of stool seen in the rectum concerning for impaction. Small calcified uterine fibroid. No definite traumatic injury seen in the abdomen or  pelvis. Electronically Signed   By: Lynwood Landy Raddle M.D.   On: 01/20/2024 14:55   DG Chest 1 View Result Date: 01/20/2024 CLINICAL DATA:  Trauma. EXAM: CHEST  1 VIEW COMPARISON:  Radiograph dated 08/14/2022. FINDINGS: No focal consolidation, pleural effusion or pneumothorax. The cardiac silhouette is within limits. Atherosclerotic calcification of the aorta. No acute osseous pathology. Degenerative changes of the spine. IMPRESSION: No active disease. Electronically Signed   By: Vanetta Chou M.D.   On: 01/20/2024 13:27     Procedures   Medications Ordered in the ED  ciprofloxacin  (CIPRO ) tablet 500 mg (has no administration in time range)  iohexol  (OMNIPAQUE ) 300 MG/ML solution 100 mL (100 mLs Intravenous Contrast Given 01/20/24 1420)  sodium phosphate (FLEET) enema 1 enema (1 enema Rectal Given 01/20/24 1720)                                    Medical Decision Making Amount and/or Complexity of Data Reviewed Labs: ordered. Radiology: ordered.  Risk OTC drugs. Prescription drug management.   Medical Decision Making:   Mary Keller is a 89 y.o. female who presented to the ED today with concerns of a fall detailed above.    Additional history discussed with patient's family/caregivers.  External chart has been reviewed including previous imaging and lab work. Patient's presentation is complicated by their history of advanced Alzheimer's dementia.  Complete initial physical exam performed, notably the patient  was alert to her baseline however is aphasic and only gives single sound responses to yes or no questions.  She does follow basic commands, such as allowing the nurse to place a  thermometer in her mouth, and to lift her arms.  Give single word responses when asked if there is pain in a certain area. Reviewed and confirmed nursing documentation for past medical history, family history, social history.    Initial Assessment:   With the patient's presentation of fall, most likely diagnosis is potential trauma to affected areas, potential UTI due to chronic brief wearing and urinary incontinence.      Initial Plan:  Obtain CT imaging of the head and C-spine to rule out any acute fractures or intracranial pathology. Screening labs including CBC and Metabolic panel to evaluate for infectious or metabolic etiology of disease.  Urinalysis with reflex culture ordered to evaluate for UTI or relevant urologic/nephrologic pathology.  Obtain PT/INR to evaluate coagulation status. CXR to evaluate for structural/infectious intrathoracic pathology.  EKG to evaluate for cardiac pathology Objective evaluation as below reviewed   Initial Study Results:   Laboratory  All laboratory results reviewed without evidence of clinically relevant pathology.   Exceptions include: Elevated white count of 14.2, hemoglobin and hematocrit are elevated.  Rare bacteria along with scant hemoglobinuria and hazy appearing urine.  EKG EKG was reviewed independently. Rate, rhythm, axis, intervals all examined and without medically relevant abnormality. ST segments without concerns for elevations.    Radiology:  All images reviewed independently. Agree with radiology report at this time.   CT HEAD WO CONTRAST Result Date: 01/20/2024 CLINICAL DATA:  Head trauma, moderate-severe, dementia EXAM: CT HEAD WITHOUT CONTRAST TECHNIQUE: Contiguous axial images were obtained from the base of the skull through the vertex without intravenous contrast. RADIATION DOSE REDUCTION: This exam was performed according to the departmental dose-optimization program which includes automated exposure control, adjustment of the  mA and/or kV according to patient size and/or use of  iterative reconstruction technique. COMPARISON:  August 14, 2022 FINDINGS: Brain: Proportional prominence of the ventricles and sulci, consistent with diffuse cerebral parenchymal volume loss. The ventricles otherwise maintained midline position without midline shift. Gray-white differentiation is preserved.Periventricular and subcortical white matter hypoattenuation, most consistent with changes of moderate chronic ischemic microvascular disease.No evidence of acute territorial infarction, extra-axial fluid collection, hemorrhage, or mass lesion. The basilar cisterns are patent without downward herniation. The cerebellar hemispheres and vermis are well formed without mass lesion or focal attenuation abnormality. No cerebellar tonsillar ectopia greater than 5 mm. Vascular: No hyperdense vessel. Calcified atherosclerotic plaque within the cavernous/supraclinoid ICA and intradural vertebral arteries. Skull: Normal. Negative for fracture or focal lesion. Sinuses/Orbits: The paranasal sinuses and mastoids are clear.The globes appear intact. No retrobulbar hematoma. Other: None. IMPRESSION: 1. No acute intracranial abnormality, specifically, no acute hemorrhage, territorial infarction, or intracranial mass. 2. Global cerebral volume loss with sequelae of advanced chronic ischemic microvascular disease. Electronically Signed   By: Rogelia Myers M.D.   On: 01/20/2024 15:21   CT CERVICAL SPINE WO CONTRAST Result Date: 01/20/2024 CLINICAL DATA:  Poly trauma, blunt. Unwitnessed fall. History of dementia. EXAM: CT CERVICAL SPINE WITHOUT CONTRAST TECHNIQUE: Multidetector CT imaging of the cervical spine was performed without intravenous contrast. Multiplanar CT image reconstructions were also generated. RADIATION DOSE REDUCTION: This exam was performed according to the departmental dose-optimization program which includes automated exposure control, adjustment of the mA  and/or kV according to patient size and/or use of iterative reconstruction technique. COMPARISON:  CT cervical spine 05/01/2004. FINDINGS: Alignment: Reversal of the usual cervical lordosis without focal angulation or significant listhesis. Skull base and vertebrae: No evidence of acute cervical spine fracture or traumatic subluxation. Chronic progressive erosive changes anteriorly at C1-2 with partially calcified pannus around the odontoid process. Multilevel spondylosis with endplate osteophytes, uncinate spurring and facet hypertrophy. The left C5-6 facet joint appears ankylosed. Soft tissues and spinal canal: No prevertebral fluid or swelling. No visible canal hematoma. Disc levels: Multilevel spondylosis, mildly progressive compared with remote examination. No evidence of large disc herniation. Mild foraminal narrowing at multiple levels due to uncinate spurring and facet hypertrophy. Upper chest: Mild scarring at the lung apices. Other: Bilateral carotid atherosclerosis. IMPRESSION: 1. No evidence of acute cervical spine fracture, traumatic subluxation or static signs of instability. 2. Multilevel cervical spondylosis, mildly progressive compared with remote examination. Electronically Signed   By: Elsie Perone M.D.   On: 01/20/2024 14:59   CT ABDOMEN PELVIS W CONTRAST Result Date: 01/20/2024 CLINICAL DATA:  Unwitnessed fall. EXAM: CT ABDOMEN AND PELVIS WITH CONTRAST TECHNIQUE: Multidetector CT imaging of the abdomen and pelvis was performed using the standard protocol following bolus administration of intravenous contrast. RADIATION DOSE REDUCTION: This exam was performed according to the departmental dose-optimization program which includes automated exposure control, adjustment of the mA and/or kV according to patient size and/or use of iterative reconstruction technique. CONTRAST:  OMNIPAQUE  IOHEXOL  300 MG/ML  SOLN COMPARISON:  None Available. FINDINGS: Lower chest: No acute abnormality.  Hepatobiliary: Minimal cholelithiasis. No biliary dilatation. Liver is unremarkable. Pancreas: Unremarkable. No pancreatic ductal dilatation or surrounding inflammatory changes. Spleen: Calcified splenic granulomas are noted. Adrenals/Urinary Tract: Adrenal glands are unremarkable. Kidneys are normal, without renal calculi, focal lesion, or hydronephrosis. Bladder is unremarkable. Stomach/Bowel: Stomach is within normal limits. Appendix appears normal. No evidence of bowel wall thickening, distention, or inflammatory changes. Moderate amount of stool seen in the rectum. Vascular/Lymphatic: No significant vascular findings are present. No enlarged abdominal or pelvic lymph  nodes. Reproductive: Small calcified uterine fibroid is noted. No adnexal abnormality is noted. Other: No ascites or hernia is noted. Musculoskeletal: No acute or significant osseous findings. IMPRESSION: Minimal cholelithiasis. Moderate amount of stool seen in the rectum concerning for impaction. Small calcified uterine fibroid. No definite traumatic injury seen in the abdomen or pelvis. Electronically Signed   By: Lynwood Landy Raddle M.D.   On: 01/20/2024 14:55   DG Chest 1 View Result Date: 01/20/2024 CLINICAL DATA:  Trauma. EXAM: CHEST  1 VIEW COMPARISON:  Radiograph dated 08/14/2022. FINDINGS: No focal consolidation, pleural effusion or pneumothorax. The cardiac silhouette is within limits. Atherosclerotic calcification of the aorta. No acute osseous pathology. Degenerative changes of the spine. IMPRESSION: No active disease. Electronically Signed   By: Vanetta Chou M.D.   On: 01/20/2024 13:27   DG Lumbar Spine Complete Result Date: 01/15/2024 CLINICAL DATA:  fall EXAM: LUMBAR SPINE - COMPLETE 4+ VIEW COMPARISON:  X-ray lumbar spine 03/21/2004 FINDINGS: Limited evaluation due to overlapping osseous structures and overlying soft tissues. There is no evidence of lumbar spine fracture. Dextrocurvature of the lumbar spine centered at the  L2-L3 level. Grade 1 anterolisthesis of L5 on S1. Multilevel severe degenerative changes of the spine. Question L5-S1 osseous neural foraminal stenosis. Intervertebral disc spaces are maintained. IMPRESSION: No acute displaced fracture or traumatic listhesis of the lumbar spine. Limited evaluation due to overlapping osseous structures and overlying soft tissues. Electronically Signed   By: Morgane  Naveau M.D.   On: 01/15/2024 04:00    Reassessment and Plan:   After evaluation of this patient along with review of objective data, there is a leukocytosis however lactate is normal and kidney function remains within normal limits.  There is no evidence of any endorgan dysfunction, and therefore does not appear this patient has any signs of sepsis at this time.  Given that she does have increased bacteria, in the setting of previous UTI, will begin outpatient course of ciprofloxacin  as this patient is allergic to cephalosporins and beta-lactam's.  Further, evaluation after fall did not demonstrate any acute bony abnormalities, the reevaluation through physical evaluation and CT imaging and x-ray did not demonstrate any bony abnormalities, and do not believe patient has a fracture at this time.  She is unable to ambulate at baseline and therefore cannot ambulate the patient however she does not have any pain with palpation to the bilateral greater trochanters, nor does she have any pain with rotation flexion or extension of the affected extremities.  As such do not believe the patient has a hip fracture at this time.  Concerning infection that was noted on the CT scan.  Enema was given which failed to produce substantial stool within 1 hour.  Reassessed the patient and noticed that she did have large volume of stool in the rectal vault.  Perform digital disimpaction with large volume of stool removed from the rectum.  Patient subjectively states this is made her condition much better.   Concern for urinary  retention, when there was a urinary catheterization nursing noted that there was approximately 1 L of urine that was evacuated from the bladder.  Repeat bladder scan performed, normal volume of urine was appreciated on bladder scan.  As such, believe this urinary retention is secondary to large volume stool impaction.  Plan at this time is to have patient discharged back to facility with beforementioned therapies and follow-up to be made with her primary care physician for recurrent constipation/fecal impaction.  Also follow-up on her previous  urinary retention which is resolved after disimpaction was achieved.  Repeated falls may be secondary to decline in her mental status secondary to the advanced Alzheimer's dementia.  Would recommend continued follow-up with her primary care for monitoring of her mental status.       Final diagnoses:  Fall, initial encounter  Lower urinary tract infectious disease    ED Discharge Orders          Ordered    Bladder scan  Status:  Canceled        01/20/24 1758    ciprofloxacin  (CIPRO ) 500 MG tablet  Every 12 hours        01/20/24 1800               Myriam Dorn BROCKS, [place] 01/20/24 1858    Myriam Dorn BROCKS, PA 01/20/24 1859    Towana Ozell BROCKS, MD 01/21/24 [phone]

## 2024-01-20 NOTE — ED Triage Notes (Signed)
 Patient BIB [facility] from [facility]. Unwitnessed fall. Has dementia that is worsening per staff. Patient crying out when EMS touched both of her hips. C collar placed by EMS. Staff said there was blood in her urine recently. No blood thinners reported.

## 2024-01-20 NOTE — Discharge Instructions (Signed)
 Please have Mary Keller follow with her primary care regarding this fecal impactions as well as her recent falls and bladder infections.

## 2024-01-25 LAB — CULTURE, BLOOD (ROUTINE X 2): Culture: NO GROWTH

## 2024-02-04 NOTE — Progress Notes (Signed)
 Protime was ordered on this patient secondary to having an unwitnessed fall and having a mental status decline.  Evaluation of her coagulation status was necessary to properly stratify her risk for intracranial hemorrhage, as well as risk of hemorrhage in general, occurring as a result of this fall.

## 2024-03-13 DEATH — deceased
